# Patient Record
Sex: Male | Born: 1965 | Race: White | Hispanic: No | Marital: Married | Smoking: Never smoker
Health system: Southern US, Community
[De-identification: ages and names within clinical notes are randomized; demographics above are authoritative.]

## PROBLEM LIST (undated history)

## (undated) DIAGNOSIS — K5730 Diverticulosis of large intestine without perforation or abscess without bleeding: Secondary | ICD-10-CM

## (undated) DIAGNOSIS — E079 Disorder of thyroid, unspecified: Secondary | ICD-10-CM

## (undated) DIAGNOSIS — Q249 Congenital malformation of heart, unspecified: Secondary | ICD-10-CM

## (undated) DIAGNOSIS — C801 Malignant (primary) neoplasm, unspecified: Secondary | ICD-10-CM

## (undated) HISTORY — DX: Malignant (primary) neoplasm, unspecified: C80.1

## (undated) HISTORY — DX: Congenital malformation of heart, unspecified: Q24.9

## (undated) HISTORY — PX: OTHER SURGICAL HISTORY: SHX169

## (undated) HISTORY — DX: Disorder of thyroid, unspecified: E07.9

## (undated) HISTORY — PX: HERNIA REPAIR: SHX51

## (undated) HISTORY — PX: TONSILLECTOMY: SUR1361

## (undated) HISTORY — PX: THYROIDECTOMY: SHX17

## (undated) HISTORY — PX: EYE SURGERY: SHX253

## (undated) HISTORY — DX: Diverticulosis of large intestine without perforation or abscess without bleeding: K57.30

## (undated) HISTORY — PX: INGUINAL HERNIA REPAIR: SUR1180

---

## 2016-05-11 DIAGNOSIS — I83813 Varicose veins of bilateral lower extremities with pain: Secondary | ICD-10-CM | POA: Insufficient documentation

## 2016-10-02 DIAGNOSIS — H52223 Regular astigmatism, bilateral: Secondary | ICD-10-CM | POA: Insufficient documentation

## 2016-10-02 DIAGNOSIS — H532 Diplopia: Secondary | ICD-10-CM | POA: Insufficient documentation

## 2020-08-02 ENCOUNTER — Other Ambulatory Visit: Payer: Self-pay

## 2020-08-03 ENCOUNTER — Ambulatory Visit (INDEPENDENT_AMBULATORY_CARE_PROVIDER_SITE_OTHER): Admitting: Family Medicine

## 2020-08-03 ENCOUNTER — Encounter: Payer: Self-pay | Admitting: Family Medicine

## 2020-08-03 VITALS — BP 118/76 | HR 70 | Temp 97.6°F | Ht 72.0 in | Wt 207.0 lb

## 2020-08-03 DIAGNOSIS — E041 Nontoxic single thyroid nodule: Secondary | ICD-10-CM

## 2020-08-03 DIAGNOSIS — Z0000 Encounter for general adult medical examination without abnormal findings: Secondary | ICD-10-CM

## 2020-08-03 DIAGNOSIS — E079 Disorder of thyroid, unspecified: Secondary | ICD-10-CM

## 2020-08-03 DIAGNOSIS — F5101 Primary insomnia: Secondary | ICD-10-CM | POA: Diagnosis not present

## 2020-08-03 LAB — CBC
HCT: 40.1 % (ref 39.0–52.0)
Hemoglobin: 13.3 g/dL (ref 13.0–17.0)
MCHC: 33.3 g/dL (ref 30.0–36.0)
MCV: 82.5 fl (ref 78.0–100.0)
Platelets: 281 10*3/uL (ref 150.0–400.0)
RBC: 4.86 Mil/uL (ref 4.22–5.81)
RDW: 18.6 % — ABNORMAL HIGH (ref 11.5–15.5)
WBC: 5.4 10*3/uL (ref 4.0–10.5)

## 2020-08-03 LAB — COMPREHENSIVE METABOLIC PANEL
ALT: 20 U/L (ref 0–53)
AST: 22 U/L (ref 0–37)
Albumin: 4.4 g/dL (ref 3.5–5.2)
Alkaline Phosphatase: 58 U/L (ref 39–117)
BUN: 18 mg/dL (ref 6–23)
CO2: 29 mEq/L (ref 19–32)
Calcium: 9.9 mg/dL (ref 8.4–10.5)
Chloride: 104 mEq/L (ref 96–112)
Creatinine, Ser: 1.21 mg/dL (ref 0.40–1.50)
GFR: 62.78 mL/min (ref >60.00)
Glucose, Bld: 94 mg/dL (ref 70–99)
Potassium: 4.4 mEq/L (ref 3.5–5.1)
Sodium: 139 mEq/L (ref 135–145)
Total Bilirubin: 0.4 mg/dL (ref 0.2–1.2)
Total Protein: 7.1 g/dL (ref 6.0–8.3)

## 2020-08-03 LAB — LIPID PANEL
Cholesterol: 233 mg/dL — ABNORMAL HIGH (ref 0–200)
HDL: 50.2 mg/dL (ref >39.00)
LDL Cholesterol: 153 mg/dL — ABNORMAL HIGH (ref 0–99)
NonHDL: 182.5
Total CHOL/HDL Ratio: 5
Triglycerides: 146 mg/dL (ref 0.0–149.0)
VLDL: 29.2 mg/dL (ref 0.0–40.0)

## 2020-08-03 LAB — URINALYSIS, ROUTINE W REFLEX MICROSCOPIC
Bilirubin Urine: NEGATIVE
Hgb urine dipstick: NEGATIVE
Ketones, ur: NEGATIVE
Leukocytes,Ua: NEGATIVE
Nitrite: NEGATIVE
RBC / HPF: NONE SEEN (ref 0–?)
Specific Gravity, Urine: 1.025 (ref 1.000–1.030)
Total Protein, Urine: NEGATIVE
Urine Glucose: NEGATIVE
Urobilinogen, UA: 0.2 (ref 0.0–1.0)
pH: 5.5 (ref 5.0–8.0)

## 2020-08-03 LAB — PSA: PSA: 0.54 ng/mL (ref 0.10–4.00)

## 2020-08-03 LAB — LDL CHOLESTEROL, DIRECT: Direct LDL: 152 mg/dL

## 2020-08-03 LAB — TSH: TSH: 1.26 u[IU]/mL (ref 0.35–4.50)

## 2020-08-03 NOTE — Patient Instructions (Signed)
Health Maintenance, Male Adopting a healthy lifestyle and getting preventive care are important in promoting health and wellness. Ask your health care provider about:  The right schedule for you to have regular tests and exams.  Things you can do on your own to prevent diseases and keep yourself healthy. What should I know about diet, weight, and exercise? Eat a healthy diet   Eat a diet that includes plenty of vegetables, fruits, low-fat dairy products, and lean protein.  Do not eat a lot of foods that are high in solid fats, added sugars, or sodium. Maintain a healthy weight Body mass index (BMI) is a measurement that can be used to identify possible weight problems. It estimates body fat based on height and weight. Your health care provider can help determine your BMI and help you achieve or maintain a healthy weight. Get regular exercise Get regular exercise. This is one of the most important things you can do for your health. Most adults should:  Exercise for at least 150 minutes each week. The exercise should increase your heart rate and make you sweat (moderate-intensity exercise).  Do strengthening exercises at least twice a week. This is in addition to the moderate-intensity exercise.  Spend less time sitting. Even light physical activity can be beneficial. Watch cholesterol and blood lipids Have your blood tested for lipids and cholesterol at 53 years of age, then have this test every 5 years. You may need to have your cholesterol levels checked more often if:  Your lipid or cholesterol levels are high.  You are older than 53 years of age.  You are at high risk for heart disease. What should I know about cancer screening? Many types of cancers can be detected early and may often be prevented. Depending on your health history and family history, you may need to have cancer screening at various ages. This may include screening for:  Colorectal cancer.  Prostate  cancer.  Skin cancer.  Lung cancer. What should I know about heart disease, diabetes, and high blood pressure? Blood pressure and heart disease  High blood pressure causes heart disease and increases the risk of stroke. This is more likely to develop in people who have high blood pressure readings, are of African descent, or are overweight.  Talk with your health care provider about your target blood pressure readings.  Have your blood pressure checked: ? Every 3-5 years if you are 18-39 years of age. ? Every year if you are 40 years old or older.  If you are between the ages of 65 and 75 and are a current or former smoker, ask your health care provider if you should have a one-time screening for abdominal aortic aneurysm (AAA). Diabetes Have regular diabetes screenings. This checks your fasting blood sugar level. Have the screening done:  Once every three years after age 45 if you are at a normal weight and have a low risk for diabetes.  More often and at a younger age if you are overweight or have a high risk for diabetes. What should I know about preventing infection? Hepatitis B If you have a higher risk for hepatitis B, you should be screened for this virus. Talk with your health care provider to find out if you are at risk for hepatitis B infection. Hepatitis C Blood testing is recommended for:  Everyone born from 1945 through 1965.  Anyone with known risk factors for hepatitis C. Sexually transmitted infections (STIs)  You should be screened each year   for STIs, including gonorrhea and chlamydia, if: ? You are sexually active and are younger than 53 years of age. ? You are older than 53 years of age and your health care provider tells you that you are at risk for this type of infection. ? Your sexual activity has changed since you were last screened, and you are at increased risk for chlamydia or gonorrhea. Ask your health care provider if you are at risk.  Ask your  health care provider about whether you are at high risk for HIV. Your health care provider may recommend a prescription medicine to help prevent HIV infection. If you choose to take medicine to prevent HIV, you should first get tested for HIV. You should then be tested every 3 months for as long as you are taking the medicine. Follow these instructions at home: Lifestyle  Do not use any products that contain nicotine or tobacco, such as cigarettes, e-cigarettes, and chewing tobacco. If you need help quitting, ask your health care provider.  Do not use street drugs.  Do not share needles.  Ask your health care provider for help if you need support or information about quitting drugs. Alcohol use  Do not drink alcohol if your health care provider tells you not to drink.  If you drink alcohol: ? Limit how much you have to 0-2 drinks a day. ? Be aware of how much alcohol is in your drink. In the [place] one drink equals one 12 oz bottle of beer (355 mL), one 5 oz glass of wine (148 mL), or one 1 oz glass of hard liquor (44 mL). General instructions  Schedule regular health, dental, and eye exams.  Stay current with your vaccines.  Tell your health care provider if: ? You often feel depressed. ? You have ever been abused or do not feel safe at home. Summary  Adopting a healthy lifestyle and getting preventive care are important in promoting health and wellness.  Follow your health care provider's instructions about healthy diet, exercising, and getting tested or screened for diseases.  Follow your health care provider's instructions on monitoring your cholesterol and blood pressure. This information is not intended to replace advice given to you by your health care provider. Make sure you discuss any questions you have with your health care provider. Document Revised: 11/27/2018 Document Reviewed: 11/27/2018 Elsevier Patient Education  [place].  Insomnia Insomnia is a  sleep disorder that makes it difficult to fall asleep or stay asleep. Insomnia can cause fatigue, low energy, difficulty concentrating, mood swings, and poor performance at work or school. There are three different ways to classify insomnia:  Difficulty falling asleep.  Difficulty staying asleep.  Waking up too early in the morning. Any type of insomnia can be long-term (chronic) or short-term (acute). Both are common. Short-term insomnia usually lasts for three months or less. Chronic insomnia occurs at least three times a week for longer than three months. What are the causes? Insomnia may be caused by another condition, situation, or substance, such as:  Anxiety.  Certain medicines.  Gastroesophageal reflux disease (GERD) or other gastrointestinal conditions.  Asthma or other breathing conditions.  Restless legs syndrome, sleep apnea, or other sleep disorders.  Chronic pain.  Menopause.  Stroke.  Abuse of alcohol, tobacco, or illegal drugs.  Mental health conditions, such as depression.  Caffeine.  Neurological disorders, such as Alzheimer's disease.  An overactive thyroid (hyperthyroidism). Sometimes, the cause of insomnia may not be known. What increases the  risk? Risk factors for insomnia include:  Gender. Women are affected more often than men.  Age. Insomnia is more common as you get older.  Stress.  Lack of exercise.  Irregular work schedule or working night shifts.  Traveling between different time zones.  Certain medical and mental health conditions. What are the signs or symptoms? If you have insomnia, the main symptom is having trouble falling asleep or having trouble staying asleep. This may lead to other symptoms, such as:  Feeling fatigued or having low energy.  Feeling nervous about going to sleep.  Not feeling rested in the morning.  Having trouble concentrating.  Feeling irritable, anxious, or depressed. How is this diagnosed? This  condition may be diagnosed based on:  Your symptoms and medical history. Your health care provider may ask about: ? Your sleep habits. ? Any medical conditions you have. ? Your mental health.  A physical exam. How is this treated? Treatment for insomnia depends on the cause. Treatment may focus on treating an underlying condition that is causing insomnia. Treatment may also include:  Medicines to help you sleep.  Counseling or therapy.  Lifestyle adjustments to help you sleep better. Follow these instructions at home: Eating and drinking   Limit or avoid alcohol, caffeinated beverages, and cigarettes, especially close to bedtime. These can disrupt your sleep.  Do not eat a large meal or eat spicy foods right before bedtime. This can lead to digestive discomfort that can make it hard for you to sleep. Sleep habits   Keep a sleep diary to help you and your health care provider figure out what could be causing your insomnia. Write down: ? When you sleep. ? When you wake up during the night. ? How well you sleep. ? How rested you feel the next day. ? Any side effects of medicines you are taking. ? What you eat and drink.  Make your bedroom a dark, comfortable place where it is easy to fall asleep. ? Put up shades or blackout curtains to block light from outside. ? Use a white noise machine to block noise. ? Keep the temperature cool.  Limit screen use before bedtime. This includes: ? Watching TV. ? Using your smartphone, tablet, or computer.  Stick to a routine that includes going to bed and waking up at the same times every day and night. This can help you fall asleep faster. Consider making a quiet activity, such as reading, part of your nighttime routine.  Try to avoid taking naps during the day so that you sleep better at night.  Get out of bed if you are still awake after 15 minutes of trying to sleep. Keep the lights down, but try reading or doing a quiet activity.  When you feel sleepy, go back to bed. General instructions  Take over-the-counter and prescription medicines only as told by your health care provider.  Exercise regularly, as told by your health care provider. Avoid exercise starting several hours before bedtime.  Use relaxation techniques to manage stress. Ask your health care provider to suggest some techniques that may work well for you. These may include: ? Breathing exercises. ? Routines to release muscle tension. ? Visualizing peaceful scenes.  Make sure that you drive carefully. Avoid driving if you feel very sleepy.  Keep all follow-up visits as told by your health care provider. This is important. Contact a health care provider if:  You are tired throughout the day.  You have trouble in your daily routine due  to sleepiness.  You continue to have sleep problems, or your sleep problems get worse. Get help right away if:  You have serious thoughts about hurting yourself or someone else. If you ever feel like you may hurt yourself or others, or have thoughts about taking your own life, get help right away. You can go to your nearest emergency department or call:  Your local emergency services (911 in the [place]  A suicide crisis helpline, such as the [facility] at (804)793-2775. This is open 24 hours a day. Summary  Insomnia is a sleep disorder that makes it difficult to fall asleep or stay asleep.  Insomnia can be long-term (chronic) or short-term (acute).  Treatment for insomnia depends on the cause. Treatment may focus on treating an underlying condition that is causing insomnia.  Keep a sleep diary to help you and your health care provider figure out what could be causing your insomnia. This information is not intended to replace advice given to you by your health care provider. Make sure you discuss any questions you have with your health care provider. Document Revised: 11/16/2017  Document Reviewed: 09/13/2017 Elsevier Patient Education  2020 [employer]  Preventive Care 35-59 Years Old, Male Preventive care refers to lifestyle choices and visits with your health care provider that can promote health and wellness. This includes:  A yearly physical exam. This is also called an annual well check.  Regular dental and eye exams.  Immunizations.  Screening for certain conditions.  Healthy lifestyle choices, such as eating a healthy diet, getting regular exercise, not using drugs or products that contain nicotine and tobacco, and limiting alcohol use. What can I expect for my preventive care visit? Physical exam Your health care provider will check:  Height and weight. These may be used to calculate body mass index (BMI), which is a measurement that tells if you are at a healthy weight.  Heart rate and blood pressure.  Your skin for abnormal spots. Counseling Your health care provider may ask you questions about:  Alcohol, tobacco, and drug use.  Emotional well-being.  Home and relationship well-being.  Sexual activity.  Eating habits.  Work and work Statistician. What immunizations do I need?  Influenza (flu) vaccine  This is recommended every year. Tetanus, diphtheria, and pertussis (Tdap) vaccine  You may need a Td booster every 10 years. Varicella (chickenpox) vaccine  You may need this vaccine if you have not already been vaccinated. Zoster (shingles) vaccine  You may need this after age 15. Measles, mumps, and rubella (MMR) vaccine  You may need at least one dose of MMR if you were born in 1957 or later. You may also need a second dose. Pneumococcal conjugate (PCV13) vaccine  You may need this if you have certain conditions and were not previously vaccinated. Pneumococcal polysaccharide (PPSV23) vaccine  You may need one or two doses if you smoke cigarettes or if you have certain conditions. Meningococcal conjugate (MenACWY)  vaccine  You may need this if you have certain conditions. Hepatitis A vaccine  You may need this if you have certain conditions or if you travel or work in places where you may be exposed to hepatitis A. Hepatitis B vaccine  You may need this if you have certain conditions or if you travel or work in places where you may be exposed to hepatitis B. Haemophilus influenzae type b (Hib) vaccine  You may need this if you have certain risk factors. Human papillomavirus (HPV) vaccine  If recommended by your health care provider, you may need three doses over 6 months. You may receive vaccines as individual doses or as more than one vaccine together in one shot (combination vaccines). Talk with your health care provider about the risks and benefits of combination vaccines. What tests do I need? Blood tests  Lipid and cholesterol levels. These may be checked every 5 years, or more frequently if you are over 55 years old.  Hepatitis C test.  Hepatitis B test. Screening  Lung cancer screening. You may have this screening every year starting at age 17 if you have a 30-pack-year history of smoking and currently smoke or have quit within the past 15 years.  Prostate cancer screening. Recommendations will vary depending on your family history and other risks.  Colorectal cancer screening. All adults should have this screening starting at age 59 and continuing until age 37. Your health care provider may recommend screening at age 62 if you are at increased risk. You will have tests every 1-10 years, depending on your results and the type of screening test.  Diabetes screening. This is done by checking your blood sugar (glucose) after you have not eaten for a while (fasting). You may have this done every 1-3 years.  Sexually transmitted disease (STD) testing. Follow these instructions at home: Eating and drinking  Eat a diet that includes fresh fruits and vegetables, whole grains, lean protein,  and low-fat dairy products.  Take vitamin and mineral supplements as recommended by your health care provider.  Do not drink alcohol if your health care provider tells you not to drink.  If you drink alcohol: ? Limit how much you have to 0-2 drinks a day. ? Be aware of how much alcohol is in your drink. In the [place] one drink equals one 12 oz bottle of beer (355 mL), one 5 oz glass of wine (148 mL), or one 1 oz glass of hard liquor (44 mL). Lifestyle  Take daily care of your teeth and gums.  Stay active. Exercise for at least 30 minutes on 5 or more days each week.  Do not use any products that contain nicotine or tobacco, such as cigarettes, e-cigarettes, and chewing tobacco. If you need help quitting, ask your health care provider.  If you are sexually active, practice safe sex. Use a condom or other form of protection to prevent STIs (sexually transmitted infections).  Talk with your health care provider about taking a low-dose aspirin every day starting at age 33. What's next?  Go to your health care provider once a year for a well check visit.  Ask your health care provider how often you should have your eyes and teeth checked.  Stay up to date on all vaccines. This information is not intended to replace advice given to you by your health care provider. Make sure you discuss any questions you have with your health care provider. Document Revised: 11/28/2018 Document Reviewed: 11/28/2018 Elsevier Patient Education  2020 [employer].

## 2020-08-03 NOTE — Progress Notes (Addendum)
New Patient Office Visit  Subjective:  Patient ID: Antonio Graham, male    DOB: 1966/12/06  Age: 54 y.o. MRN: [id number]  CC:  Chief Complaint  Patient presents with  . Establish Care    New patient, no concerns patient fasting for labs.     HPI Antonio Graham presents for establishment of care, physical exam and follow-up of longstanding insomnia.  Insomnia has been treated effectively with 6.25 mg of Ambien.  Medication works well for him.  He denies any sleep aberrancy and his wife confirms.  Married for 30 years.  3 of his 4 children are grown and flown.  One still lives in a house.  Moved to this area back in 2019 problem.  He is working out on a regular basis.  He does not smoke or use illicit drugs.  He drinks alcohol rarely.  Concerned about his thyroid.  Exercises regularly.  Parents are in their 3s in relatively good health.  Father smokes 2 packs of cigarettes daily and still runs a small farm  History reviewed. No pertinent past medical history.  Past Surgical History:  Procedure Laterality Date  . EYE SURGERY    . HERNIA REPAIR    . thumb surgery Right   . TONSILLECTOMY      Family History  Problem Relation Age of Onset  . Healthy Mother   . Healthy Father     Social History   Socioeconomic History  . Marital status: Married    Spouse name: Not on file  . Number of children: Not on file  . Years of education: Not on file  . Highest education level: Not on file  Occupational History  . Not on file  Tobacco Use  . Smoking status: Never Smoker  . Smokeless tobacco: Never Used  Vaping Use  . Vaping Use: Never used  Substance and Sexual Activity  . Alcohol use: Yes    Alcohol/week: 2.0 standard drinks    Types: 1 Cans of beer, 1 Shots of liquor per week  . Drug use: Never  . Sexual activity: Yes  Other Topics Concern  . Not on file  Social History Narrative  . Not on file   Social Determinants of Health   Financial Resource Strain:   .  Difficulty of Paying Living Expenses: Not on file  Food Insecurity:   . Worried About Charity fundraiser in the Last Year: Not on file  . Ran Out of Food in the Last Year: Not on file  Transportation Needs:   . Lack of Transportation (Medical): Not on file  . Lack of Transportation (Non-Medical): Not on file  Physical Activity:   . Days of Exercise per Week: Not on file  . Minutes of Exercise per Session: Not on file  Stress:   . Feeling of Stress : Not on file  Social Connections:   . Frequency of Communication with Friends and Family: Not on file  . Frequency of Social Gatherings with Friends and Family: Not on file  . Attends Religious Services: Not on file  . Active Member of Clubs or Organizations: Not on file  . Attends Archivist Meetings: Not on file  . Marital Status: Not on file  Intimate Partner Violence:   . Fear of Current or Ex-Partner: Not on file  . Emotionally Abused: Not on file  . Physically Abused: Not on file  . Sexually Abused: Not on file    ROS Review of Systems  Constitutional:  Negative.   HENT: Negative.   Eyes: Negative for photophobia and visual disturbance.  Respiratory: Negative.   Cardiovascular: Negative.   Gastrointestinal: Negative.  Negative for anal bleeding, blood in stool, constipation and diarrhea.  Endocrine: Negative for cold intolerance, heat intolerance, polyphagia and polyuria.  Genitourinary: Negative for difficulty urinating, frequency and urgency.  Musculoskeletal: Negative for gait problem and joint swelling.  Skin: Negative for color change and pallor.  Neurological: Negative for tremors and speech difficulty.  Hematological: Does not bruise/bleed easily.  Psychiatric/Behavioral: Negative.    Depression screen [facility] 2/9 08/03/2020 08/03/2020  Decreased Interest 0 0  Down, Depressed, Hopeless 0 0  PHQ - 2 Score 0 0  Altered sleeping 2 -  Tired, decreased energy 1 -  Change in appetite 1 -  Feeling bad or failure  about yourself  0 -  Trouble concentrating 0 -  Moving slowly or fidgety/restless 0 -  Suicidal thoughts 0 -  PHQ-9 Score 4 -  Difficult doing work/chores Not difficult at all -    Objective:   Today's Vitals: BP 118/76   Pulse 70   Temp 97.6 F (36.4 C) (Tympanic)   Ht 6' (1.829 m)   Wt 207 lb (93.9 kg)   SpO2 96%   BMI 28.07 kg/m   Physical Exam Vitals and nursing note reviewed.  Constitutional:      General: He is not in acute distress.    Appearance: Normal appearance. He is normal weight. He is not ill-appearing, toxic-appearing or diaphoretic.  HENT:     Head: Normocephalic and atraumatic.     Right Ear: Tympanic membrane, ear canal and external ear normal.     Left Ear: Tympanic membrane, ear canal and external ear normal.     Nose: Nose normal. No congestion or rhinorrhea.     Mouth/Throat:     Mouth: Mucous membranes are moist.     Pharynx: Oropharynx is clear. No oropharyngeal exudate or posterior oropharyngeal erythema.  Eyes:     General: No scleral icterus.       Right eye: No discharge.        Left eye: No discharge.     Extraocular Movements: Extraocular movements intact.     Conjunctiva/sclera: Conjunctivae normal.     Pupils: Pupils are equal, round, and reactive to light.  Neck:     Vascular: No carotid bruit.   Cardiovascular:     Rate and Rhythm: Normal rate and regular rhythm.  Pulmonary:     Effort: Pulmonary effort is normal.     Breath sounds: Normal breath sounds.  Abdominal:     General: Abdomen is flat. Bowel sounds are normal. There is no distension.     Palpations: Abdomen is soft. There is no mass.     Tenderness: There is no abdominal tenderness. There is no guarding or rebound.     Hernia: No hernia is present. There is no hernia in the left inguinal area or right inguinal area.  Genitourinary:    Penis: Normal and circumcised. No hypospadias, erythema, tenderness, discharge or swelling.      Testes:        Right: Mass,  tenderness or swelling not present. Right testis is descended.        Left: Mass, tenderness or swelling not present. Left testis is descended.     Epididymis:     Right: Not inflamed or enlarged.     Left: Not inflamed or enlarged.  Musculoskeletal:     Cervical  back: No rigidity or tenderness.  Lymphadenopathy:     Cervical: No cervical adenopathy.     Lower Body: No right inguinal adenopathy. No left inguinal adenopathy.  Neurological:     Mental Status: He is alert.     Assessment & Plan:   Problem List Items Addressed This Visit    None    Visit Diagnoses    Healthcare maintenance    -  Primary   Relevant Orders   CBC (Completed)   Comprehensive metabolic panel (Completed)   LDL cholesterol, direct (Completed)   Lipid panel (Completed)   PSA (Completed)   Urinalysis, Routine w reflex microscopic (Completed)   Ambulatory referral to Gastroenterology   Primary insomnia       Thyroid condition       Relevant Orders   TSH (Completed)   US THYROID (Completed)   Ambulatory referral to Endocrinology   Thyroid nodule       Relevant Orders   Ambulatory referral to Interventional Radiology      Outpatient Encounter Medications as of 08/03/2020  Medication Sig  . Multiple Vitamin (MULTI VITAMIN MENS) tablet Take 1 tablet by mouth daily.  Marland Kitchen zolpidem (AMBIEN CR) 6.25 MG CR tablet Take 6.25 mg by mouth at bedtime.   No facility-administered encounter medications on file as of 08/03/2020.    Follow-up: Return in about 1 year (around 08/03/2021), or if symptoms worsen or fail to improve.  Given information on health maintenance and disease prevention as well as insomnia. Libby Maw, MD

## 2020-08-31 ENCOUNTER — Ambulatory Visit
Admission: RE | Admit: 2020-08-31 | Discharge: 2020-08-31 | Disposition: A | Discharge status: Home / Self Care | Source: Ambulatory Visit | Attending: Family Medicine | Admitting: Family Medicine

## 2020-08-31 ENCOUNTER — Other Ambulatory Visit: Payer: Self-pay

## 2020-08-31 DIAGNOSIS — E079 Disorder of thyroid, unspecified: Secondary | ICD-10-CM

## 2020-09-02 ENCOUNTER — Encounter: Payer: Self-pay | Admitting: Family Medicine

## 2020-09-02 NOTE — Telephone Encounter (Signed)
Have ordered a FNB and consult with endocrinology.

## 2020-09-02 NOTE — Addendum Note (Signed)
Addended by: Jon Billings on: 09/02/2020 01:27 PM   Modules accepted: Orders

## 2020-09-06 NOTE — Addendum Note (Signed)
Addended by: Jon Billings on: 09/06/2020 01:08 PM   Modules accepted: Orders

## 2020-09-07 ENCOUNTER — Other Ambulatory Visit: Payer: Self-pay | Admitting: Family Medicine

## 2020-09-07 DIAGNOSIS — E041 Nontoxic single thyroid nodule: Secondary | ICD-10-CM

## 2020-09-14 ENCOUNTER — Other Ambulatory Visit

## 2020-09-21 ENCOUNTER — Other Ambulatory Visit

## 2020-09-23 ENCOUNTER — Ambulatory Visit
Admission: RE | Admit: 2020-09-23 | Discharge: 2020-09-23 | Disposition: A | Discharge status: Home / Self Care | Source: Ambulatory Visit | Attending: Family Medicine | Admitting: Family Medicine

## 2020-09-23 ENCOUNTER — Other Ambulatory Visit (HOSPITAL_COMMUNITY)
Admission: RE | Admit: 2020-09-23 | Discharge: 2020-09-23 | Disposition: A | Discharge status: Home / Self Care | Source: Ambulatory Visit | Attending: Student | Admitting: Student

## 2020-09-23 DIAGNOSIS — E041 Nontoxic single thyroid nodule: Secondary | ICD-10-CM

## 2020-09-23 DIAGNOSIS — C73 Malignant neoplasm of thyroid gland: Secondary | ICD-10-CM | POA: Diagnosis not present

## 2020-09-28 LAB — CYTOLOGY - NON PAP

## 2020-09-30 NOTE — Telephone Encounter (Signed)
I would wait for endocrinology consult before seeing the surgeon.  Fine needle biopsies of thyroid nodules are unreliable and he will need to be under an endocrinologist's care anyway if he does indeed have thyroid cancer.

## 2020-10-18 ENCOUNTER — Encounter: Payer: Self-pay | Admitting: Family Medicine

## 2020-10-20 ENCOUNTER — Other Ambulatory Visit: Payer: Self-pay

## 2020-10-20 ENCOUNTER — Encounter: Payer: Self-pay | Admitting: Endocrinology

## 2020-10-20 ENCOUNTER — Ambulatory Visit (INDEPENDENT_AMBULATORY_CARE_PROVIDER_SITE_OTHER): Admitting: Endocrinology

## 2020-10-20 DIAGNOSIS — E041 Nontoxic single thyroid nodule: Secondary | ICD-10-CM | POA: Insufficient documentation

## 2020-10-20 NOTE — Patient Instructions (Addendum)
Please see a surgery specialist.  you will receive a phone call, about a day and time for an appointment.   Please consider the Radioactive iodine treatment pill options we discussed today.   Please come back after you get out of the hospital.

## 2020-10-20 NOTE — Progress Notes (Signed)
Subjective:    Patient ID: Antonio Graham, male    DOB: 09-14-1966, 54 y.o.   MRN: [id number]  HPI Pt is referred by Dr Ethelene Hal, for nodular thyroid.  Pt was noted to have a nodule at the thyroid in 2021, when he was shaving.  He is unaware of ever having had thyroid problems in the past.  He has no h/o XRT or surgery to the neck.    Past Surgical History:  Procedure Laterality Date  . EYE SURGERY    . HERNIA REPAIR    . thumb surgery Right   . TONSILLECTOMY      Social History   Socioeconomic History  . Marital status: Married    Spouse name: Not on file  . Number of children: Not on file  . Years of education: Not on file  . Highest education level: Not on file  Occupational History  . Not on file  Tobacco Use  . Smoking status: Never Smoker  . Smokeless tobacco: Never Used  Vaping Use  . Vaping Use: Never used  Substance and Sexual Activity  . Alcohol use: Yes    Alcohol/week: 2.0 standard drinks    Types: 1 Cans of beer, 1 Shots of liquor per week  . Drug use: Never  . Sexual activity: Yes  Other Topics Concern  . Not on file  Social History Narrative  . Not on file   Social Determinants of Health   Financial Resource Strain:   . Difficulty of Paying Living Expenses: Not on file  Food Insecurity:   . Worried About Charity fundraiser in the Last Year: Not on file  . Ran Out of Food in the Last Year: Not on file  Transportation Needs:   . Lack of Transportation (Medical): Not on file  . Lack of Transportation (Non-Medical): Not on file  Physical Activity:   . Days of Exercise per Week: Not on file  . Minutes of Exercise per Session: Not on file  Stress:   . Feeling of Stress : Not on file  Social Connections:   . Frequency of Communication with Friends and Family: Not on file  . Frequency of Social Gatherings with Friends and Family: Not on file  . Attends Religious Services: Not on file  . Active Member of Clubs or Organizations: Not on file  .  Attends Archivist Meetings: Not on file  . Marital Status: Not on file  Intimate Partner Violence:   . Fear of Current or Ex-Partner: Not on file  . Emotionally Abused: Not on file  . Physically Abused: Not on file  . Sexually Abused: Not on file    Current Outpatient Medications on File Prior to Visit  Medication Sig Dispense Refill  . Multiple Vitamin (MULTI VITAMIN MENS) tablet Take 1 tablet by mouth daily.    Marland Kitchen zolpidem (AMBIEN CR) 6.25 MG CR tablet Take 6.25 mg by mouth at bedtime.     No current facility-administered medications on file prior to visit.    No Known Allergies  Family History  Problem Relation Age of Onset  . Healthy Mother   . Healthy Father   . Thyroid disease Neg Hx     BP 128/80   Pulse 83   Ht 6' (1.829 m)   Wt 209 lb (94.8 kg)   SpO2 98%   BMI 28.35 kg/m    Review of Systems Denies hoarseness, neck pain, sob, dysphagia, diarrhea, and flushing.  He has s  slight dry cough.     Objective:   Physical Exam VITAL SIGNS:  See vs page GENERAL: no distress NECK: 2 cm left thyroid nodule is freely mobile.     Lab Results  Component Value Date   TSH 1.26 08/03/2020   [place]: Single mass versus two adjacent nodular suspicious masses within the inferior aspect of the left lobe of the thyroid gland as described above. This either represents two separate adjacent nodules measuring roughly 3 cm each or one larger mass area. Sonographic features are suspicious for malignancy and fine-needle aspiration is recommended. Technically, biopsy is recommended of both nodule 1 and nodule 2 above. Detailed imaging at the time of biopsy may be able to further delineate whether this appears to be truly a single mass or two separate masses.  Bx: Suspicious for malignancy (Bethesda category V).    SPECIMEN ADEQUACY:  Satisfactory for evaluation   DIAGNOSTIC COMMENTS:  Suspicious for papillary carcinoma.    I have reviewed outside records, and  summarized: Pt was noted to have thyroid nodule, and referred here.  This ws noted at wellness visit.  General health was good.     Assessment & Plan:  Thyroid nodule, new to me, high risk of malignancy.  We discussed the need for surgery.   Patient Instructions  Please see a surgery specialist.  you will receive a phone call, about a day and time for an appointment.   Please consider the Radioactive iodine treatment pill options we discussed today.   Please come back after you get out of the hospital.

## 2020-11-05 ENCOUNTER — Other Ambulatory Visit: Payer: Self-pay

## 2020-11-05 MED ORDER — ZOLPIDEM TARTRATE ER 6.25 MG PO TBCR
6.2500 mg | EXTENDED_RELEASE_TABLET | Freq: Every evening | ORAL | 0 refills | Status: DC
Start: 2020-11-05 — End: 2020-12-07

## 2020-11-05 NOTE — Telephone Encounter (Signed)
Last OV 08/03/20 Last fill 06/30/20

## 2020-11-19 ENCOUNTER — Telehealth: Payer: Self-pay | Admitting: Endocrinology

## 2020-11-19 NOTE — Telephone Encounter (Signed)
Pt had surgery and f/u at [facility] [place]. [facility] released back to [place] to f/u and arrange radioactive iodine  treatments. Per pt spouse Dena. Call [phone].

## 2020-11-19 NOTE — Telephone Encounter (Signed)
Needs f/u appt next available 

## 2020-11-19 NOTE — Telephone Encounter (Signed)
Antonio Graham if you have time can you schedule this please?

## 2020-11-22 NOTE — Telephone Encounter (Signed)
Appointment is on 12/8

## 2020-11-24 ENCOUNTER — Other Ambulatory Visit: Payer: Self-pay

## 2020-11-24 ENCOUNTER — Encounter: Payer: Self-pay | Admitting: Endocrinology

## 2020-11-24 ENCOUNTER — Ambulatory Visit (INDEPENDENT_AMBULATORY_CARE_PROVIDER_SITE_OTHER): Admitting: Endocrinology

## 2020-11-24 DIAGNOSIS — C73 Malignant neoplasm of thyroid gland: Secondary | ICD-10-CM | POA: Diagnosis not present

## 2020-11-24 DIAGNOSIS — E890 Postprocedural hypothyroidism: Secondary | ICD-10-CM

## 2020-11-24 NOTE — Patient Instructions (Addendum)
you will receive a phone call, about a day and time for an appointment, for the injections and iodine pill.  Please do the blood tests prior to the shots Then you would do a "post-therapy scan" a few days later. Please come back for a follow-up appointment in 3 months.

## 2020-11-24 NOTE — Progress Notes (Signed)
   Subjective:    Patient ID: Antonio Graham, male    DOB: 02-May-1966, 54 y.o.   MRN: [id number]  HPI Pt returns for f/u of PTC;  He had thyroidect 8 days ago.  pt states he feels well in general.  He takes synthroid as rx'ed.   No past medical history on file.  Past Surgical History:  Procedure Laterality Date  . EYE SURGERY    . HERNIA REPAIR    . thumb surgery Right   . TONSILLECTOMY      Social History   Socioeconomic History  . Marital status: Married    Spouse name: Not on file  . Number of children: Not on file  . Years of education: Not on file  . Highest education level: Not on file  Occupational History  . Not on file  Tobacco Use  . Smoking status: Never Smoker  . Smokeless tobacco: Never Used  Vaping Use  . Vaping Use: Never used  Substance and Sexual Activity  . Alcohol use: Yes    Alcohol/week: 2.0 standard drinks    Types: 1 Cans of beer, 1 Shots of liquor per week  . Drug use: Never  . Sexual activity: Yes  Other Topics Concern  . Not on file  Social History Narrative  . Not on file   Social Determinants of Health   Financial Resource Strain: Not on file  Food Insecurity: Not on file  Transportation Needs: Not on file  Physical Activity: Not on file  Stress: Not on file  Social Connections: Not on file  Intimate Partner Violence: Not on file    Current Outpatient Medications on File Prior to Visit  Medication Sig Dispense Refill  . levothyroxine (SYNTHROID) 137 MCG tablet Take by mouth.    . Multiple Vitamin (MULTI VITAMIN MENS) tablet Take 1 tablet by mouth daily.    Marland Kitchen zolpidem (AMBIEN CR) 6.25 MG CR tablet Take 1 tablet (6.25 mg total) by mouth at bedtime. 30 tablet 0   No current facility-administered medications on file prior to visit.    No Known Allergies  Family History  Problem Relation Age of Onset  . Healthy Mother   . Healthy Father   . Thyroid disease Neg Hx     BP 110/70   Pulse 63   Ht 6' (1.829 m)   Wt 209 lb  6.4 oz (95 kg)   SpO2 98%   BMI 28.40 kg/m     Review of Systems     Objective:   Physical Exam VITAL SIGNS:  See vs page.  GENERAL: no distress.  Neck: a healed scar is present.  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  He chooses RAI with thyrogen.   Patient Instructions  you will receive a phone call, about a day and time for an appointment, for the injections and iodine pill.  Please do the blood tests prior to the shots Then you would do a "post-therapy scan" a few days later. Please come back for a follow-up appointment in 3 months.

## 2020-11-27 DIAGNOSIS — E039 Hypothyroidism, unspecified: Secondary | ICD-10-CM | POA: Insufficient documentation

## 2020-12-07 ENCOUNTER — Telehealth: Payer: Self-pay

## 2020-12-07 MED ORDER — ZOLPIDEM TARTRATE ER 6.25 MG PO TBCR
6.2500 mg | EXTENDED_RELEASE_TABLET | Freq: Every evening | ORAL | 0 refills | Status: DC
Start: 2020-12-07 — End: 2021-01-05

## 2020-12-07 NOTE — Telephone Encounter (Signed)
Please see message and advise.  Thank you. Last OV 08/03/20 Last fill 11/05/20  #30/0

## 2020-12-11 ENCOUNTER — Encounter (INDEPENDENT_AMBULATORY_CARE_PROVIDER_SITE_OTHER): Payer: Self-pay

## 2020-12-13 ENCOUNTER — Other Ambulatory Visit (HOSPITAL_COMMUNITY)
Admission: RE | Admit: 2020-12-13 | Discharge: 2020-12-13 | Disposition: A | Discharge status: Home / Self Care | Source: Ambulatory Visit

## 2020-12-13 ENCOUNTER — Other Ambulatory Visit: Payer: Self-pay

## 2020-12-13 ENCOUNTER — Ambulatory Visit (HOSPITAL_COMMUNITY)
Admission: RE | Admit: 2020-12-13 | Discharge: 2020-12-13 | Disposition: A | Discharge status: Home / Self Care | Source: Ambulatory Visit | Attending: Endocrinology | Admitting: Endocrinology

## 2020-12-13 DIAGNOSIS — C73 Malignant neoplasm of thyroid gland: Secondary | ICD-10-CM | POA: Insufficient documentation

## 2020-12-13 LAB — BASIC METABOLIC PANEL
Anion gap: 9 (ref 5–15)
BUN: 17 mg/dL (ref 6–20)
CO2: 27 mmol/L (ref 22–32)
Calcium: 10.2 mg/dL (ref 8.9–10.3)
Chloride: 103 mmol/L (ref 98–111)
Creatinine, Ser: 1.19 mg/dL (ref 0.61–1.24)
GFR, Estimated: >60 mL/min (ref >60)
Glucose, Bld: 107 mg/dL — ABNORMAL HIGH (ref 70–99)
Potassium: 4.4 mmol/L (ref 3.5–5.1)
Sodium: 139 mmol/L (ref 135–145)

## 2020-12-13 LAB — T4, FREE: Free T4: 1.09 ng/dL (ref 0.61–1.12)

## 2020-12-13 LAB — TSH: TSH: 2.98 u[IU]/mL (ref 0.350–4.500)

## 2020-12-13 MED ORDER — THYROTROPIN ALFA 0.9 MG IM SOLR: 0.9000 mg | INTRAMUSCULAR | Status: AC

## 2020-12-13 MED ORDER — THYROTROPIN ALFA 0.9 MG IM SOLR
INTRAMUSCULAR | Status: AC
  Administered 2020-12-13: 0.9 mg via INTRAMUSCULAR
  Filled 2020-12-13: qty 0.9

## 2020-12-14 ENCOUNTER — Encounter (HOSPITAL_COMMUNITY)
Admission: RE | Admit: 2020-12-14 | Discharge: 2020-12-14 | Disposition: A | Discharge status: Home / Self Care | Source: Ambulatory Visit | Attending: Endocrinology | Admitting: Endocrinology

## 2020-12-14 DIAGNOSIS — C73 Malignant neoplasm of thyroid gland: Secondary | ICD-10-CM | POA: Diagnosis not present

## 2020-12-14 MED ORDER — THYROTROPIN ALFA 0.9 MG IM SOLR
INTRAMUSCULAR | Status: AC
  Administered 2020-12-14: 0.9 mg via INTRAMUSCULAR
  Filled 2020-12-14: qty 0.9

## 2020-12-14 MED ORDER — THYROTROPIN ALFA 0.9 MG IM SOLR: 0.9000 mg | INTRAMUSCULAR | Status: AC

## 2020-12-15 ENCOUNTER — Other Ambulatory Visit: Payer: Self-pay

## 2020-12-15 ENCOUNTER — Encounter (HOSPITAL_COMMUNITY)
Admission: RE | Admit: 2020-12-15 | Discharge: 2020-12-15 | Disposition: A | Discharge status: Home / Self Care | Source: Ambulatory Visit | Attending: Endocrinology | Admitting: Endocrinology

## 2020-12-15 DIAGNOSIS — C73 Malignant neoplasm of thyroid gland: Secondary | ICD-10-CM | POA: Insufficient documentation

## 2020-12-15 MED ORDER — SODIUM IODIDE I 131 CAPSULE
127.5000 | Freq: Once | INTRAVENOUS | Status: AC
  Administered 2020-12-15: 127.5 via ORAL

## 2020-12-20 ENCOUNTER — Ambulatory Visit (HOSPITAL_COMMUNITY)

## 2020-12-21 ENCOUNTER — Ambulatory Visit (HOSPITAL_COMMUNITY)

## 2020-12-22 ENCOUNTER — Ambulatory Visit (HOSPITAL_COMMUNITY)

## 2020-12-23 ENCOUNTER — Other Ambulatory Visit: Payer: Self-pay | Admitting: Endocrinology

## 2020-12-23 ENCOUNTER — Encounter: Payer: Self-pay | Admitting: Endocrinology

## 2020-12-23 ENCOUNTER — Ambulatory Visit (HOSPITAL_COMMUNITY)
Admission: RE | Admit: 2020-12-23 | Discharge: 2020-12-23 | Disposition: A | Discharge status: Home / Self Care | Source: Ambulatory Visit | Attending: Endocrinology | Admitting: Endocrinology

## 2020-12-23 ENCOUNTER — Other Ambulatory Visit: Payer: Self-pay

## 2020-12-23 DIAGNOSIS — C73 Malignant neoplasm of thyroid gland: Secondary | ICD-10-CM

## 2020-12-24 ENCOUNTER — Other Ambulatory Visit: Payer: Self-pay | Admitting: Endocrinology

## 2020-12-24 DIAGNOSIS — C73 Malignant neoplasm of thyroid gland: Secondary | ICD-10-CM

## 2020-12-31 ENCOUNTER — Ambulatory Visit (HOSPITAL_COMMUNITY)

## 2021-01-03 ENCOUNTER — Ambulatory Visit (HOSPITAL_COMMUNITY)

## 2021-01-03 ENCOUNTER — Other Ambulatory Visit (HOSPITAL_COMMUNITY)

## 2021-01-04 ENCOUNTER — Ambulatory Visit (HOSPITAL_COMMUNITY): Admission: RE | Admit: 2021-01-04 | Source: Ambulatory Visit

## 2021-01-04 ENCOUNTER — Encounter (HOSPITAL_COMMUNITY): Payer: Self-pay

## 2021-01-05 ENCOUNTER — Other Ambulatory Visit: Payer: Self-pay | Admitting: Family

## 2021-01-06 ENCOUNTER — Other Ambulatory Visit: Payer: Self-pay

## 2021-01-06 ENCOUNTER — Ambulatory Visit (HOSPITAL_COMMUNITY): Admission: RE | Admit: 2021-01-06 | Source: Ambulatory Visit

## 2021-01-06 ENCOUNTER — Encounter (HOSPITAL_COMMUNITY): Payer: Self-pay

## 2021-01-06 ENCOUNTER — Ambulatory Visit (HOSPITAL_COMMUNITY)
Admission: RE | Admit: 2021-01-06 | Discharge: 2021-01-06 | Disposition: A | Discharge status: Home / Self Care | Source: Ambulatory Visit | Attending: Endocrinology | Admitting: Endocrinology

## 2021-01-06 DIAGNOSIS — C73 Malignant neoplasm of thyroid gland: Secondary | ICD-10-CM | POA: Diagnosis present

## 2021-01-06 MED ORDER — GADOBUTROL 1 MMOL/ML IV SOLN
9.0000 mL | Freq: Once | INTRAVENOUS | Status: AC | PRN
  Administered 2021-01-06: 9 mL via INTRAVENOUS

## 2021-01-06 NOTE — Progress Notes (Signed)
Spoke with radiologist Dr. Jobe Igo regarding Chest MRI protocol.

## 2021-01-14 ENCOUNTER — Other Ambulatory Visit: Payer: Self-pay | Admitting: Endocrinology

## 2021-01-14 DIAGNOSIS — C73 Malignant neoplasm of thyroid gland: Secondary | ICD-10-CM

## 2021-01-18 DIAGNOSIS — Q231 Congenital insufficiency of aortic valve: Secondary | ICD-10-CM | POA: Insufficient documentation

## 2021-01-18 DIAGNOSIS — Q2381 Bicuspid aortic valve: Secondary | ICD-10-CM | POA: Insufficient documentation

## 2021-01-24 ENCOUNTER — Ambulatory Visit (HOSPITAL_COMMUNITY)
Admission: RE | Admit: 2021-01-24 | Discharge: 2021-01-24 | Disposition: A | Discharge status: Home / Self Care | Source: Ambulatory Visit | Attending: Endocrinology | Admitting: Endocrinology

## 2021-01-24 ENCOUNTER — Other Ambulatory Visit: Payer: Self-pay

## 2021-01-24 DIAGNOSIS — C73 Malignant neoplasm of thyroid gland: Secondary | ICD-10-CM | POA: Diagnosis not present

## 2021-01-26 ENCOUNTER — Encounter: Payer: Self-pay | Admitting: Endocrinology

## 2021-01-26 ENCOUNTER — Other Ambulatory Visit: Payer: Self-pay | Admitting: Endocrinology

## 2021-01-26 DIAGNOSIS — I712 Thoracic aortic aneurysm, without rupture, unspecified: Secondary | ICD-10-CM

## 2021-01-31 DIAGNOSIS — I712 Thoracic aortic aneurysm, without rupture: Secondary | ICD-10-CM | POA: Insufficient documentation

## 2021-01-31 DIAGNOSIS — I7121 Aneurysm of the ascending aorta, without rupture: Secondary | ICD-10-CM | POA: Insufficient documentation

## 2021-02-03 ENCOUNTER — Telehealth: Payer: Self-pay | Admitting: Endocrinology

## 2021-02-03 NOTE — Telephone Encounter (Signed)
LMTCB

## 2021-02-03 NOTE — Telephone Encounter (Signed)
Dr.Chen from [facility] recently saw pt in clinic and wanted to speak to [place] and update him on the pt. Please give him a call at [phone].

## 2021-02-07 ENCOUNTER — Encounter: Payer: Self-pay | Admitting: Family Medicine

## 2021-02-09 ENCOUNTER — Encounter: Payer: Self-pay | Admitting: Family Medicine

## 2021-02-10 ENCOUNTER — Telehealth (INDEPENDENT_AMBULATORY_CARE_PROVIDER_SITE_OTHER): Admitting: Family Medicine

## 2021-02-10 DIAGNOSIS — F419 Anxiety disorder, unspecified: Secondary | ICD-10-CM

## 2021-02-10 DIAGNOSIS — F439 Reaction to severe stress, unspecified: Secondary | ICD-10-CM | POA: Diagnosis not present

## 2021-02-10 NOTE — Patient Instructions (Signed)
       FOR YOUR ANXIETY AND STRESS:   []  Consider Cognitive Behavioral Therapy - this often is as effective as medications with fewer side effects. Please use the number provided to set up an appointment.  [place] is a good option.   Call for appointment: [phone]  []  Continue exercise as long as this has been ok with your surgeon and cardiovascular specialists.  []  Set a schedule that includes adequate time for sleep and fun activities.  []  Medications are best used short term while finding a healthier more balanced life that promotes good emotional and mental health. Please schedule an in person visit with your Primary care provider if any worsening, the above measures are not helpful or if you decide to try a medication. Your primary care provider may be able to prescribe controlled anxiety medications, or may suggest a referral to a psychiatrist.      I hope you are feeling better soon! Follow up with your doctor in 3-4 weeks or sooner if sooner if your symptoms worsen or new concerns arise.

## 2021-02-10 NOTE — Progress Notes (Signed)
Virtual Visit via Video Note  I connected with Antonio Graham  on 02/10/21 at  6:00 PM EST by a video enabled telemedicine application and verified that I am speaking with the correct person using two identifiers.  Location patient: home,  Location provider:work or home office Persons participating in the virtual visit: patient, provider  I discussed the limitations of evaluation and management by telemedicine and the availability of in person appointments. The patient expressed understanding and agreed to proceed.   HPI:  Acute telemedicine visit for Anxiety/Stress: -situational -Symptoms include: anxiety, feels overwhelmed, when running and gets any little sensation - wonders if it is something with his heart -he sometimes can calm the anxiety on his own with exercise and reasoning through things -recently diagnosed with thyroid cancer a few months ago, had to do surgery, seeing specialist for management, back to normal physically, but then diagnosed with aortic aneurysm as well (seeing specialist for this as well) -Denies: depression, panic attacks currently, SI, thoughts of harm, hallucinations, severe symptoms -Has tried: exercise   ROS: See pertinent positives and negatives per HPI.  No past medical history on file.  Past Surgical History:  Procedure Laterality Date  . EYE SURGERY    . HERNIA REPAIR    . thumb surgery Right   . TONSILLECTOMY       Current Outpatient Medications:  .  levothyroxine (SYNTHROID) 137 MCG tablet, Take by mouth., Disp: , Rfl:  .  Multiple Vitamin (MULTI VITAMIN MENS) tablet, Take 1 tablet by mouth daily., Disp: , Rfl:  .  zolpidem (AMBIEN CR) 6.25 MG CR tablet, TAKE 1 TABLET BY MOUTH DAILY AT BEDTIME, Disp: 30 tablet, Rfl: 1  EXAM:  VITALS per patient if applicable:  GENERAL: alert, oriented, appears well and in no acute distress  HEENT: atraumatic, conjunttiva clear, no obvious abnormalities on inspection of external nose and ears  NECK:  normal movements of the head and neck  LUNGS: on inspection no signs of respiratory distress, breathing rate appears normal, no obvious gross SOB, gasping or wheezing  CV: no obvious cyanosis  MS: moves all visible extremities without noticeable abnormality  PSYCH/NEURO: pleasant and cooperative, no obvious depression or anxiety, speech and thought processing grossly intact  ASSESSMENT AND PLAN:  Discussed the following assessment and plan:  Stress  Anxiety  -we discussed possible serious and likely etiologies, options for evaluation and workup, limitations of telemedicine visit vs in person visit, treatment, treatment risks and precautions. Pt prefers to treat via telemedicine empirically rather than in person at this moment.  Discussed various treatment options, both pharmacological and nonpharmacological, for stress and anxiety.  He reports that he does not really like to take medications.  Opted to try [facility], number provided for him to call to schedule.  He is considering benzodiazepines to use on occasion, prefers that to a daily medication.  Did let him know that these medications are restricted with telemedicine visit protocols; would need to follow-up through PCP office or see psychiatrist if he chooses to go that route.  He was understanding. Advised to seek prompt in person care if worsening, new symptoms arise, or if is not improving with treatment. Discussed options for inperson care if PCP office not available. Did let this patient know that I only do telemedicine on Tuesdays and Thursdays for [place]. Advised to schedule follow up visit with PCP or [facility] if any further questions or concerns to avoid delays in care.   I discussed the assessment and treatment  plan with the patient. The patient was provided an opportunity to ask questions and all were answered. The patient agreed with the plan and demonstrated an understanding of the instructions.     Lucretia Kern,  DO

## 2021-02-10 NOTE — Telephone Encounter (Signed)
Please advise messages below patient having concerns about anxiety would like temporary medications for this asking for virtual visit. Would this be okay or should patient schedule office visit?

## 2021-02-18 ENCOUNTER — Other Ambulatory Visit: Payer: Self-pay

## 2021-02-18 ENCOUNTER — Ambulatory Visit (INDEPENDENT_AMBULATORY_CARE_PROVIDER_SITE_OTHER): Admitting: Family Medicine

## 2021-02-18 ENCOUNTER — Encounter: Payer: Self-pay | Admitting: Family Medicine

## 2021-02-18 VITALS — BP 126/78 | HR 74 | Temp 97.7°F | Ht 72.0 in | Wt 201.8 lb

## 2021-02-18 DIAGNOSIS — F418 Other specified anxiety disorders: Secondary | ICD-10-CM | POA: Diagnosis not present

## 2021-02-18 MED ORDER — ALPRAZOLAM 0.5 MG PO TABS: 0.5000 mg | ORAL_TABLET | Freq: Every day | ORAL | 0 refills | Status: DC | PRN

## 2021-02-18 NOTE — Progress Notes (Signed)
Antonio Graham is a 55 y.o. male  Chief Complaint  Patient presents with  . Anxiety    Patient here to discuss temporary medications to help with anxiety due to most recent diagnoses and procedures he have had.    HPI: Antonio Graham is a 55 y.o. male patient of Dr. Ethelene Hal who is seen today to discuss stress related to recent medical diagnoses - papillary thyroid carcinoma, s/p total thyroidectomy and  Radioactive iodine, new dx of 4.5cm thoracic aortic aneursym.  He runs regularly (25 miles per week), trying yoga with wife.  He has h/o insomnia, takes [place] CR 6.25mg  which was effective until the past few months. He wakes up at night and mind 2-3 days per week pt is having trouble with worrying, trouble relaxing.  Would like to try PRN med. Would also consider [facility].    GAD 7 : Generalized Anxiety Score 02/18/2021 08/03/2020  Nervous, Anxious, on Edge 3 0  Control/stop worrying 3 0  Worry too much - different things 2 0  Trouble relaxing 2 0  Restless 0 0  Easily annoyed or irritable 0 0  Afraid - awful might happen 2 0  Total GAD 7 Score 12 0  Anxiety Difficulty Not difficult at all Not difficult at all    Depression screen [facility] 2/9 02/18/2021 02/18/2021 08/03/2020  Decreased Interest 0 1 0  Down, Depressed, Hopeless 0 - 0  PHQ - 2 Score 0 1 0  Altered sleeping 0 - 2  Tired, decreased energy 0 - 1  Change in appetite 0 - 1  Feeling bad or failure about yourself  1 - 0  Trouble concentrating 1 - 0  Moving slowly or fidgety/restless 0 - 0  Suicidal thoughts 0 - 0  PHQ-9 Score 2 - 4  Difficult doing work/chores Not difficult at all - Not difficult at all      No past medical history on file.  Past Surgical History:  Procedure Laterality Date  . EYE SURGERY    . HERNIA REPAIR    . thumb surgery Right   . TONSILLECTOMY      Social History   Socioeconomic History  . Marital status: Married    Spouse name: Not on file  . Number of children: Not on file  .  Years of education: Not on file  . Highest education level: Not on file  Occupational History  . Not on file  Tobacco Use  . Smoking status: Never Smoker  . Smokeless tobacco: Never Used  Vaping Use  . Vaping Use: Never used  Substance and Sexual Activity  . Alcohol use: Yes    Alcohol/week: 2.0 standard drinks    Types: 1 Cans of beer, 1 Shots of liquor per week  . Drug use: Never  . Sexual activity: Yes  Other Topics Concern  . Not on file  Social History Narrative  . Not on file   Social Determinants of Health   Financial Resource Strain: Not on file  Food Insecurity: Not on file  Transportation Needs: Not on file  Physical Activity: Not on file  Stress: Not on file  Social Connections: Not on file  Intimate Partner Violence: Not on file    Family History  Problem Relation Age of Onset  . Healthy Mother   . Healthy Father   . Thyroid disease Neg Hx      Immunization History  Administered Date(s) Administered  . PFIZER(Purple Top)SARS-COV-2 Vaccination 12/09/2019, 12/31/2019, 11/24/2020  . Tdap  10/01/2016    Outpatient Encounter Medications as of 02/18/2021  Medication Sig  . ALPRAZolam (XANAX) 0.5 MG tablet Take 1 tablet (0.5 mg total) by mouth daily as needed for anxiety.  Marland Kitchen levothyroxine (SYNTHROID) 137 MCG tablet Take by mouth.  . Multiple Vitamin (MULTI VITAMIN MENS) tablet Take 1 tablet by mouth daily.  Marland Kitchen zolpidem (AMBIEN CR) 6.25 MG CR tablet TAKE 1 TABLET BY MOUTH DAILY AT BEDTIME  . Cholecalciferol 50 MCG (2000 UT) CAPS Take 1 capsule by mouth daily.   No facility-administered encounter medications on file as of 02/18/2021.     ROS: Pertinent positives and negatives noted in HPI. Remainder of ROS non-contributory    No Known Allergies  BP 126/78   Pulse 74   Temp 97.7 F (36.5 C) (Temporal)   Ht 6' (1.829 m)   Wt 201 lb 12.8 oz (91.5 kg)   SpO2 98%   BMI 27.37 kg/m   Wt Readings from Last 3 Encounters:  02/18/21 201 lb 12.8 oz (91.5 kg)   11/24/20 209 lb 6.4 oz (95 kg)  10/20/20 209 lb (94.8 kg)   Temp Readings from Last 3 Encounters:  02/18/21 97.7 F (36.5 C) (Temporal)  08/03/20 97.6 F (36.4 C) (Tympanic)   BP Readings from Last 3 Encounters:  02/18/21 126/78  11/24/20 110/70  10/20/20 128/80   Pulse Readings from Last 3 Encounters:  02/18/21 74  11/24/20 63  10/20/20 83     Physical Exam Constitutional:      General: He is not in acute distress.    Appearance: Normal appearance. He is not ill-appearing.  Pulmonary:     Effort: No respiratory distress.  Neurological:     Mental Status: He is alert and oriented to person, place, and time.  Psychiatric:        Mood and Affect: Mood normal.        Behavior: Behavior normal.      A/P:  1. Anxiety about health - GAD-7 = 12 (moderate) - stress and anxiety mainly related to multiple new medical dx in the past few months (see HPI) Rx: - ALPRAZolam (XANAX) 0.5 MG tablet; Take 1 tablet (0.5 mg total) by mouth daily as needed for anxiety.  Dispense: 30 tablet; Refill: 0 - goal is for pt to take 2-3x/wk - controlled substance database reviewed and appropriate  - agree with consideration of [place] counseling - pt agrees to f/u if symptoms increase or he is taking xanax daily Discussed plan and reviewed medications with patient, including risks, benefits, and potential side effects. Pt expressed understand. All questions answered.   This visit occurred during the SARS-CoV-2 public health emergency.  Safety protocols were in place, including screening questions prior to the visit, additional usage of staff PPE, and extensive cleaning of exam room while observing appropriate contact time as indicated for disinfecting solutions.

## 2021-02-23 ENCOUNTER — Ambulatory Visit (INDEPENDENT_AMBULATORY_CARE_PROVIDER_SITE_OTHER): Admitting: Endocrinology

## 2021-02-23 ENCOUNTER — Other Ambulatory Visit: Payer: Self-pay

## 2021-02-23 VITALS — BP 110/80 | HR 58 | Ht 72.0 in | Wt 202.2 lb

## 2021-02-23 DIAGNOSIS — E890 Postprocedural hypothyroidism: Secondary | ICD-10-CM | POA: Diagnosis not present

## 2021-02-23 DIAGNOSIS — C73 Malignant neoplasm of thyroid gland: Secondary | ICD-10-CM | POA: Diagnosis not present

## 2021-02-23 LAB — CBC WITH DIFFERENTIAL/PLATELET
Basophils Absolute: 0 10*3/uL (ref 0.0–0.1)
Basophils Relative: 0.5 % (ref 0.0–3.0)
Eosinophils Absolute: 0.1 10*3/uL (ref 0.0–0.7)
Eosinophils Relative: 1.4 % (ref 0.0–5.0)
HCT: 43.2 % (ref 39.0–52.0)
Hemoglobin: 14.9 g/dL (ref 13.0–17.0)
Lymphocytes Relative: 35.6 % (ref 12.0–46.0)
Lymphs Abs: 1.6 10*3/uL (ref 0.7–4.0)
MCHC: 34.5 g/dL (ref 30.0–36.0)
MCV: 84.4 fl (ref 78.0–100.0)
Monocytes Absolute: 0.5 10*3/uL (ref 0.1–1.0)
Monocytes Relative: 10.9 % (ref 3.0–12.0)
Neutro Abs: 2.4 10*3/uL (ref 1.4–7.7)
Neutrophils Relative %: 51.6 % (ref 43.0–77.0)
Platelets: 213 10*3/uL (ref 150.0–400.0)
RBC: 5.11 Mil/uL (ref 4.22–5.81)
RDW: 15.7 % — ABNORMAL HIGH (ref 11.5–15.5)
WBC: 4.6 10*3/uL (ref 4.0–10.5)

## 2021-02-23 LAB — T4, FREE: Free T4: 0.97 ng/dL (ref 0.60–1.60)

## 2021-02-23 LAB — TSH: TSH: 1.92 u[IU]/mL (ref 0.35–4.50)

## 2021-02-23 NOTE — Progress Notes (Signed)
Subjective:    Patient ID: Antonio Graham, male    DOB: [birth year], 55 y.o.   MRN: [id number]  HPI Pt returns for f/u of PTC, with this chronology: 11/21 thyroidect.  Pathol: multifocal PTC, largest 3.1 CM, with 7/7 pos nodes. 1/22 RAI, 128 mCi 1/22 Post-rx scan: uptake at mediastinum and bilat pelvis 1/22 MRI pelvis: no tumor seen 2/22 Chest CT: no tumor seen pt states he feels well in general.  He takes synthroid as rx'ed.   No past medical history on file.  Past Surgical History:  Procedure Laterality Date  . EYE SURGERY    . HERNIA REPAIR    . thumb surgery Right   . TONSILLECTOMY      Social History   Socioeconomic History  . Marital status: Married    Spouse name: Not on file  . Number of children: Not on file  . Years of education: Not on file  . Highest education level: Not on file  Occupational History  . Not on file  Tobacco Use  . Smoking status: Never Smoker  . Smokeless tobacco: Never Used  Vaping Use  . Vaping Use: Never used  Substance and Sexual Activity  . Alcohol use: Yes    Alcohol/week: 2.0 standard drinks    Types: 1 Cans of beer, 1 Shots of liquor per week  . Drug use: Never  . Sexual activity: Yes  Other Topics Concern  . Not on file  Social History Narrative  . Not on file   Social Determinants of Health   Financial Resource Strain: Not on file  Food Insecurity: Not on file  Transportation Needs: Not on file  Physical Activity: Not on file  Stress: Not on file  Social Connections: Not on file  Intimate Partner Violence: Not on file    Current Outpatient Medications on File Prior to Visit  Medication Sig Dispense Refill  . ALPRAZolam (XANAX) 0.5 MG tablet Take 1 tablet (0.5 mg total) by mouth daily as needed for anxiety. 30 tablet 0  . Cholecalciferol 50 MCG (2000 UT) CAPS Take 1 capsule by mouth daily.    . Multiple Vitamin (MULTI VITAMIN MENS) tablet Take 1 tablet by mouth daily.    Marland Kitchen zolpidem (AMBIEN CR) 6.25 MG CR tablet  TAKE 1 TABLET BY MOUTH DAILY AT BEDTIME 30 tablet 1   No current facility-administered medications on file prior to visit.    No Known Allergies  Family History  Problem Relation Age of Onset  . Healthy Mother   . Healthy Father   . Thyroid disease Neg Hx     BP 110/80 (BP Location: Right Arm, Patient Position: Sitting, Cuff Size: Normal)   Pulse (!) 58   Ht 6' (1.829 m)   Wt 202 lb 3.2 oz (91.7 kg)   SpO2 97%   BMI 27.42 kg/m     Review of Systems Denies sob and hip pain    Objective:   Physical Exam VITAL SIGNS:  See vs page GENERAL: no distress Neck: a healed scar is present.  I do not appreciate a nodule in the thyroid or elsewhere in the neck   Lab Results  Component Value Date   TSH 1.92 02/23/2021      Assessment & Plan:  PTC: pt requests to check TG now, despite short time since RAI Hypothyroidism: in view of the above, he needs increased rx.  I have sent a prescription to your pharmacy, to increase synthroid.   Patient Instructions  Blood  tests are requested for you today.  We'll let you know about the results.  Please come back for a follow-up appointment in 2 months.

## 2021-02-23 NOTE — Patient Instructions (Signed)
Blood tests are requested for you today.  We'll let you know about the results.  Please come back for a follow-up appointment in 2 months.   

## 2021-02-24 LAB — THYROGLOBULIN LEVEL: Thyroglobulin: 0.4 ng/mL — ABNORMAL LOW

## 2021-02-24 LAB — THYROGLOBULIN ANTIBODY: Thyroglobulin Ab: <1 IU/mL (ref <=1)

## 2021-02-24 MED ORDER — LEVOTHYROXINE SODIUM 150 MCG PO TABS: 150.0000 ug | ORAL_TABLET | Freq: Every day | ORAL | 3 refills | Status: DC

## 2021-03-02 ENCOUNTER — Encounter: Admitting: Surgery

## 2021-03-09 ENCOUNTER — Other Ambulatory Visit: Payer: Self-pay | Admitting: Family

## 2021-04-05 ENCOUNTER — Encounter: Payer: Self-pay | Admitting: Family Medicine

## 2021-04-05 DIAGNOSIS — Z792 Long term (current) use of antibiotics: Secondary | ICD-10-CM

## 2021-04-05 MED ORDER — AMOXICILLIN 500 MG PO CAPS: ORAL_CAPSULE | ORAL | 0 refills | Status: DC

## 2021-04-12 ENCOUNTER — Telehealth: Payer: Self-pay | Admitting: Endocrinology

## 2021-04-12 NOTE — Telephone Encounter (Signed)
OK 

## 2021-04-12 NOTE — Telephone Encounter (Signed)
Routed to Jessie to reschedule pt

## 2021-04-12 NOTE — Telephone Encounter (Signed)
Please advise 

## 2021-04-12 NOTE — Telephone Encounter (Signed)
Pt's wife is calling regarding app on 04/28/2021. States that her husband will be gone for a [employer] trip for work. And is wanting to know if he can be seen after he gets back either May  13th-15th  after 3pm would work.   If possible call patient's wife.

## 2021-04-28 ENCOUNTER — Ambulatory Visit: Admitting: Endocrinology

## 2021-04-29 ENCOUNTER — Other Ambulatory Visit: Payer: Self-pay

## 2021-04-29 ENCOUNTER — Ambulatory Visit (INDEPENDENT_AMBULATORY_CARE_PROVIDER_SITE_OTHER): Admitting: Endocrinology

## 2021-04-29 VITALS — BP 120/70 | HR 60 | Ht 72.0 in | Wt 200.2 lb

## 2021-04-29 DIAGNOSIS — E890 Postprocedural hypothyroidism: Secondary | ICD-10-CM

## 2021-04-29 DIAGNOSIS — C73 Malignant neoplasm of thyroid gland: Secondary | ICD-10-CM

## 2021-04-29 NOTE — Patient Instructions (Signed)
Blood tests are requested for you today.  We'll let you know about the results.  Let's recheck the scan.  you will receive a phone call, about a day and time for an appointment.  If these tests are good, Please come back for a follow-up appointment in 4 months.

## 2021-04-29 NOTE — Progress Notes (Signed)
Subjective:    Patient ID: Antonio Graham, male    DOB: August 30, 1966, 55 y.o.   MRN: [id number]  HPI Pt returns for f/u of PTC, stage 4, with this chronology: 11/21 thyroidect.  Pathol: multifocal PTC, largest 3.1 CM, with 7/7 pos nodes. 1/22 RAI, 128 mCi 1/22 Post-rx scan: uptake at mediastinum and bilat pelvis.   1/22 MRI pelvis: no tumor seen 2/22 Chest CT: no tumor seen 3/22 TG=0.4 (Ab neg).  pt states he feels well in general, except for fatigue.   He takes synthroid as rx'ed.  Past Surgical History:  Procedure Laterality Date  . EYE SURGERY    . HERNIA REPAIR    . thumb surgery Right   . TONSILLECTOMY      Social History   Socioeconomic History  . Marital status: Married    Spouse name: Not on file  . Number of children: Not on file  . Years of education: Not on file  . Highest education level: Not on file  Occupational History  . Not on file  Tobacco Use  . Smoking status: Never Smoker  . Smokeless tobacco: Never Used  Vaping Use  . Vaping Use: Never used  Substance and Sexual Activity  . Alcohol use: Yes    Alcohol/week: 2.0 standard drinks    Types: 1 Cans of beer, 1 Shots of liquor per week  . Drug use: Never  . Sexual activity: Yes  Other Topics Concern  . Not on file  Social History Narrative  . Not on file   Social Determinants of Health   Financial Resource Strain: Not on file  Food Insecurity: Not on file  Transportation Needs: Not on file  Physical Activity: Not on file  Stress: Not on file  Social Connections: Not on file  Intimate Partner Violence: Not on file    Current Outpatient Medications on File Prior to Visit  Medication Sig Dispense Refill  . ALPRAZolam (XANAX) 0.5 MG tablet Take 1 tablet (0.5 mg total) by mouth daily as needed for anxiety. 30 tablet 0  . amoxicillin (AMOXIL) 500 MG capsule TAKE 4 TABLETS 45 MINUTES PRIOR TO DENTAL WORK. 12 capsule 0  . Cholecalciferol 50 MCG (2000 UT) CAPS Take 1 capsule by mouth daily.    Marland Kitchen  levothyroxine (SYNTHROID) 150 MCG tablet Take 1 tablet (150 mcg total) by mouth daily. 90 tablet 3  . Multiple Vitamin (MULTI VITAMIN MENS) tablet Take 1 tablet by mouth daily.    Marland Kitchen zolpidem (AMBIEN CR) 6.25 MG CR tablet TAKE 1 TABLET BY MOUTH DAILY AT BEDTIME 30 tablet 1   No current facility-administered medications on file prior to visit.    No Known Allergies  Family History  Problem Relation Age of Onset  . Healthy Mother   . Healthy Father   . Thyroid disease Neg Hx     BP 120/70 (BP Location: Right Arm, Patient Position: Sitting, Cuff Size: Normal)   Pulse 60   Ht 6' (1.829 m)   Wt 200 lb 3.2 oz (90.8 kg)   SpO2 96%   BMI 27.15 kg/m    Review of Systems     Objective:   Physical Exam VITAL SIGNS:  See vs page GENERAL: no distress Neck: a healed scar is present.  I do not appreciate a nodule in the thyroid or elsewhere in the neck  Lab Results  Component Value Date   TSH 1.54 04/29/2021      Assessment & Plan:  PTC, stage 4.  Hypothyroidism: in view of the above, he needs increased rx.  Increase synthroid to 175/d.    Patient Instructions  Blood tests are requested for you today.  We'll let you know about the results.  Let's recheck the scan.  you will receive a phone call, about a day and time for an appointment.  If these tests are good, Please come back for a follow-up appointment in 4 months.

## 2021-04-30 MED ORDER — LEVOTHYROXINE SODIUM 175 MCG PO TABS: 175.0000 ug | ORAL_TABLET | Freq: Every day | ORAL | 3 refills | Status: DC

## 2021-05-02 LAB — TSH: TSH: 1.54 mIU/L (ref 0.40–4.50)

## 2021-05-02 LAB — THYROGLOBULIN LEVEL: Thyroglobulin: 0.2 ng/mL — ABNORMAL LOW

## 2021-05-02 LAB — T4, FREE: Free T4: 1.4 ng/dL (ref 0.8–1.8)

## 2021-05-02 LAB — THYROGLOBULIN ANTIBODY: Thyroglobulin Ab: <1 IU/mL (ref <=1)

## 2021-05-06 ENCOUNTER — Other Ambulatory Visit: Payer: Self-pay | Admitting: Family

## 2021-05-09 ENCOUNTER — Encounter (HOSPITAL_COMMUNITY)
Admission: RE | Admit: 2021-05-09 | Discharge: 2021-05-09 | Disposition: A | Discharge status: Home / Self Care | Source: Ambulatory Visit | Attending: Endocrinology | Admitting: Endocrinology

## 2021-05-09 ENCOUNTER — Other Ambulatory Visit: Payer: Self-pay

## 2021-05-09 ENCOUNTER — Other Ambulatory Visit (HOSPITAL_COMMUNITY): Payer: Self-pay

## 2021-05-09 ENCOUNTER — Ambulatory Visit (HOSPITAL_COMMUNITY)
Admission: RE | Admit: 2021-05-09 | Discharge: 2021-05-09 | Disposition: A | Discharge status: Home / Self Care | Source: Ambulatory Visit | Attending: Endocrinology | Admitting: Endocrinology

## 2021-05-09 ENCOUNTER — Encounter (HOSPITAL_COMMUNITY): Payer: Self-pay

## 2021-05-09 DIAGNOSIS — C73 Malignant neoplasm of thyroid gland: Secondary | ICD-10-CM

## 2021-05-09 MED ORDER — THYROTROPIN ALFA 0.9 MG IM SOLR
0.9000 mg | INTRAMUSCULAR | Status: AC
  Administered 2021-05-09 – 2021-05-10 (×2): 0.9 mg via INTRAMUSCULAR

## 2021-05-09 MED ORDER — THYROTROPIN ALFA 0.9 MG IM SOLR
INTRAMUSCULAR | Status: AC
  Filled 2021-05-09: qty 0.9

## 2021-05-09 NOTE — Telephone Encounter (Signed)
Pt is out now

## 2021-05-10 ENCOUNTER — Encounter (HOSPITAL_COMMUNITY)
Admission: RE | Admit: 2021-05-10 | Discharge: 2021-05-10 | Disposition: A | Discharge status: Home / Self Care | Source: Ambulatory Visit | Attending: Endocrinology | Admitting: Endocrinology

## 2021-05-10 ENCOUNTER — Encounter (HOSPITAL_COMMUNITY): Payer: Self-pay

## 2021-05-10 ENCOUNTER — Encounter (HOSPITAL_COMMUNITY)

## 2021-05-10 ENCOUNTER — Telehealth: Payer: Self-pay

## 2021-05-10 DIAGNOSIS — C73 Malignant neoplasm of thyroid gland: Secondary | ICD-10-CM | POA: Diagnosis not present

## 2021-05-10 MED ORDER — THYROTROPIN ALFA 0.9 MG IM SOLR
INTRAMUSCULAR | Status: AC
  Filled 2021-05-10: qty 0.9

## 2021-05-10 MED ORDER — THYROTROPIN ALFA 0.9 MG IM SOLR
0.9000 mg | INTRAMUSCULAR | Status: AC
  Administered 2021-05-10: 0.9 mg via INTRAMUSCULAR

## 2021-05-10 NOTE — Telephone Encounter (Signed)
Pt calling requesting refill/ Pt has been out for 2 days. Last OV 02/18/21 Last fill 02/18/21 #30/1

## 2021-05-11 ENCOUNTER — Encounter (HOSPITAL_COMMUNITY)
Admission: RE | Admit: 2021-05-11 | Discharge: 2021-05-11 | Disposition: A | Discharge status: Home / Self Care | Source: Ambulatory Visit | Attending: Endocrinology | Admitting: Endocrinology

## 2021-05-11 ENCOUNTER — Encounter (HOSPITAL_COMMUNITY)

## 2021-05-11 ENCOUNTER — Encounter (HOSPITAL_COMMUNITY): Payer: Self-pay

## 2021-05-11 ENCOUNTER — Other Ambulatory Visit: Payer: Self-pay

## 2021-05-11 DIAGNOSIS — C73 Malignant neoplasm of thyroid gland: Secondary | ICD-10-CM | POA: Diagnosis not present

## 2021-05-11 MED ORDER — SODIUM IODIDE I 131 CAPSULE: 4.0000 | Freq: Once | INTRAVENOUS | Status: DC | PRN

## 2021-05-11 MED ORDER — SODIUM IODIDE I 131 CAPSULE
4.3000 | Freq: Once | INTRAVENOUS | Status: AC | PRN
  Administered 2021-05-11: 4.3 via ORAL

## 2021-05-12 NOTE — Telephone Encounter (Signed)
Tried calling patient to schedule an appointment and inform that requested rx was sent in. No answer unable to leave a message sent message via Mychart asking patient to call to schedule appointment.

## 2021-05-12 NOTE — Telephone Encounter (Signed)
Pt called in tears stating pt is not sleeping and undergoing treatments and injections and really needs his Ambien. Please try to fill today.  [place], [place] Phone:  [phone]  Fax:  [phone]

## 2021-05-12 NOTE — Telephone Encounter (Signed)
Needs office visit.

## 2021-05-13 ENCOUNTER — Encounter (HOSPITAL_COMMUNITY)
Admission: RE | Admit: 2021-05-13 | Discharge: 2021-05-13 | Disposition: A | Discharge status: Home / Self Care | Source: Ambulatory Visit | Attending: Endocrinology | Admitting: Endocrinology

## 2021-05-13 ENCOUNTER — Other Ambulatory Visit: Payer: Self-pay

## 2021-05-13 DIAGNOSIS — C73 Malignant neoplasm of thyroid gland: Secondary | ICD-10-CM | POA: Diagnosis not present

## 2021-05-16 ENCOUNTER — Encounter: Payer: Self-pay | Admitting: Endocrinology

## 2021-05-17 NOTE — Telephone Encounter (Signed)
Please advise 

## 2021-06-07 ENCOUNTER — Ambulatory Visit: Admitting: Endocrinology

## 2021-06-08 ENCOUNTER — Other Ambulatory Visit: Payer: Self-pay | Admitting: Family

## 2021-06-09 MED ORDER — ZOLPIDEM TARTRATE ER 6.25 MG PO TBCR: 6.2500 mg | EXTENDED_RELEASE_TABLET | Freq: Every evening | ORAL | 0 refills | Status: DC | PRN

## 2021-06-09 NOTE — Telephone Encounter (Signed)
Last OV 02/18/21 Last fill 05/12/21 #30/0

## 2021-06-30 ENCOUNTER — Encounter: Admitting: Family Medicine

## 2021-07-05 ENCOUNTER — Other Ambulatory Visit: Payer: Self-pay | Admitting: Family Medicine

## 2021-07-07 MED ORDER — ZOLPIDEM TARTRATE ER 6.25 MG PO TBCR: 6.2500 mg | EXTENDED_RELEASE_TABLET | Freq: Every evening | ORAL | 0 refills | Status: DC | PRN

## 2021-07-19 ENCOUNTER — Ambulatory Visit (INDEPENDENT_AMBULATORY_CARE_PROVIDER_SITE_OTHER): Admitting: Family Medicine

## 2021-07-19 ENCOUNTER — Encounter: Payer: Self-pay | Admitting: Family Medicine

## 2021-07-19 ENCOUNTER — Other Ambulatory Visit: Payer: Self-pay

## 2021-07-19 VITALS — BP 104/68 | HR 66 | Temp 97.4°F | Ht 72.0 in | Wt 191.0 lb

## 2021-07-19 DIAGNOSIS — F439 Reaction to severe stress, unspecified: Secondary | ICD-10-CM | POA: Insufficient documentation

## 2021-07-19 DIAGNOSIS — F5101 Primary insomnia: Secondary | ICD-10-CM | POA: Insufficient documentation

## 2021-07-19 DIAGNOSIS — M25561 Pain in right knee: Secondary | ICD-10-CM

## 2021-07-19 DIAGNOSIS — F418 Other specified anxiety disorders: Secondary | ICD-10-CM

## 2021-07-19 DIAGNOSIS — E7800 Pure hypercholesterolemia, unspecified: Secondary | ICD-10-CM

## 2021-07-19 DIAGNOSIS — R4589 Other symptoms and signs involving emotional state: Secondary | ICD-10-CM | POA: Insufficient documentation

## 2021-07-19 MED ORDER — ZOLPIDEM TARTRATE ER 6.25 MG PO TBCR: 6.2500 mg | EXTENDED_RELEASE_TABLET | Freq: Every evening | ORAL | 1 refills | Status: DC | PRN

## 2021-07-19 NOTE — Progress Notes (Signed)
Established Patient Office Visit  Subjective:  Patient ID: Antonio Graham, male    DOB: 26-Jul-1966  Age: 55 y.o. MRN: [id number]  CC:  Chief Complaint  Patient presents with   Annual Exam    CPE, discuss temporary use of Ambien, some right knee issues that come and go. Fasting for labs.     HPI Antonio Graham presents for follow-up of insomnia.  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  Recent history of papillary thyroid carcinoma status post thyroidectomy with near 0 levels of thyroglobulin and thyroid antibody.  Continues follow-up with endocrinology.  Ascending aortic aneurysm is under surveillance.  He does not smoke.  He has tried other medications for sleep including Lunesta.  Lunesta gave him sour taste in his mouth.  Has never had an issue with depression but does worry things.  Developed right knee pain after running a 5K and mowing his 2 acre lawn.  No specific history of injury.  It does not lock or give way.  No past medical history on file.  Past Surgical History:  Procedure Laterality Date   EYE SURGERY     HERNIA REPAIR     thumb surgery Right    TONSILLECTOMY      Family History  Problem Relation Age of Onset   Healthy Mother    Healthy Father    Thyroid disease Neg Hx     Social History   Socioeconomic History   Marital status: Married    Spouse name: Not on file   Number of children: Not on file   Years of education: Not on file   Highest education level: Not on file  Occupational History   Not on file  Tobacco Use   Smoking status: Never   Smokeless tobacco: Never  Vaping Use   Vaping Use: Never used  Substance and Sexual Activity   Alcohol use: Yes    Alcohol/week: 2.0 standard drinks    Types: 1 Cans of beer, 1 Shots of liquor per week   Drug use: Never   Sexual activity: Yes  Other Topics Concern   Not on file   Social History Narrative   Not on file   Social Determinants of Health   Financial Resource Strain: Not on file  Food Insecurity: Not on file  Transportation Needs: Not on file  Physical Activity: Not on file  Stress: Not on file  Social Connections: Not on file  Intimate Partner Violence: Not on file    Outpatient Medications Prior to Visit  Medication Sig Dispense Refill   amoxicillin (AMOXIL) 500 MG capsule TAKE 4 TABLETS 45 MINUTES PRIOR TO DENTAL WORK. 12 capsule 0   Cholecalciferol 50 MCG (2000 UT) CAPS Take 1 capsule by mouth daily.     levothyroxine (SYNTHROID) 175 MCG tablet Take 1 tablet (175 mcg total) by mouth daily. 90 tablet 3   Multiple Vitamin (MULTI VITAMIN MENS) tablet Take 1 tablet by mouth daily.     zolpidem (AMBIEN CR) 6.25 MG CR tablet Take 1 tablet (6.25 mg total) by mouth at bedtime as needed. 15 tablet 0   ALPRAZolam (XANAX) 0.5 MG tablet Take 1 tablet (0.5 mg total) by mouth daily as needed for anxiety. (Patient not taking: Reported on 07/19/2021) 30 tablet 0   No facility-administered medications prior to visit.    No Known Allergies  ROS Review  of Systems  Constitutional: Negative.   Respiratory: Negative.    Cardiovascular: Negative.   Gastrointestinal: Negative.   Genitourinary: Negative.   Musculoskeletal:  Positive for arthralgias and gait problem.  Psychiatric/Behavioral:  Positive for sleep disturbance. Negative for dysphoric mood. The patient is nervous/anxious.      Objective:    Physical Exam Vitals and nursing note reviewed.  Constitutional:      General: He is not in acute distress.    Appearance: Normal appearance. He is not ill-appearing, toxic-appearing or diaphoretic.  HENT:     Head: Normocephalic and atraumatic.     Right Ear: External ear normal.     Left Ear: External ear normal.  Eyes:     General:        Right eye: No discharge.        Left eye: No discharge.     Conjunctiva/sclera: Conjunctivae normal.   Pulmonary:     Effort: Pulmonary effort is normal.  Musculoskeletal:     Right knee: No swelling, deformity or effusion. Normal range of motion. Tenderness present over the medial joint line.     Comments: Negative grind.    Neurological:     Mental Status: He is alert and oriented to person, place, and time.  Psychiatric:        Mood and Affect: Mood normal.        Behavior: Behavior normal.    BP 104/68 (BP Location: Left Arm, Patient Position: Sitting, Cuff Size: Normal)   Pulse 66   Temp (!) 97.4 F (36.3 C) (Temporal)   Ht 6' (1.829 m)   Wt 191 lb (86.6 kg)   SpO2 98%   BMI 25.90 kg/m  Wt Readings from Last 3 Encounters:  07/19/21 191 lb (86.6 kg)  04/29/21 200 lb 3.2 oz (90.8 kg)  02/23/21 202 lb 3.2 oz (91.7 kg)     Health Maintenance Due  Topic Date Due   Pneumococcal Vaccine 90-8 Years old (1 - PCV) Never done   HIV Screening  Never done   Hepatitis C Screening  Never done   Zoster Vaccines- Shingrix (1 of 2) Never done   COVID-19 Vaccine (4 - Booster for Pfizer series) 02/22/2021   INFLUENZA VACCINE  07/18/2021    There are no preventive care reminders to display for this patient.  Lab Results  Component Value Date   TSH 1.54 04/29/2021   Lab Results  Component Value Date   WBC 4.6 02/23/2021   HGB 14.9 02/23/2021   HCT 43.2 02/23/2021   MCV 84.4 02/23/2021   PLT 213.0 02/23/2021   Lab Results  Component Value Date   NA 139 12/13/2020   K 4.4 12/13/2020   CO2 27 12/13/2020   GLUCOSE 107 (H) 12/13/2020   BUN 17 12/13/2020   CREATININE 1.19 12/13/2020   BILITOT 0.4 08/03/2020   ALKPHOS 58 08/03/2020   AST 22 08/03/2020   ALT 20 08/03/2020   PROT 7.1 08/03/2020   ALBUMIN 4.4 08/03/2020   CALCIUM 10.2 12/13/2020   ANIONGAP 9 12/13/2020   GFR 62.78 08/03/2020   Lab Results  Component Value Date   CHOL 233 (H) 08/03/2020   Lab Results  Component Value Date   HDL 50.20 08/03/2020   Lab Results  Component Value Date   LDLCALC 153  (H) 08/03/2020   Lab Results  Component Value Date   TRIG 146.0 08/03/2020   Lab Results  Component Value Date   CHOLHDL 5 08/03/2020   No results  found for: HGBA1C    Assessment & Plan:   Problem List Items Addressed This Visit       Other   Acute pain of right knee   Relevant Orders   Ambulatory referral to Sports Medicine   DG Knee Complete 4 Views Right   Primary insomnia   Relevant Medications   zolpidem (AMBIEN CR) 6.25 MG CR tablet   Stress   Anxiety about health - Primary   Elevated LDL cholesterol level    Meds ordered this encounter  Medications   zolpidem (AMBIEN CR) 6.25 MG CR tablet    Sig: Take 1 tablet (6.25 mg total) by mouth at bedtime as needed.    Dispense:  90 tablet    Refill:  1    Follow-up: Return in about 3 months (around 10/19/2021), or return fasting for physical..  Patient with long-term use of Ambien.  He has had no aberrancy with this medication.  Discussed why but increased risk of cancer, thinning of the bones and again sleep aberrancy.  Advised using also an SSRI for management of anxiety.  He declines that for now.  Asked him to think about it.  Also given information on preventing high cholesterol.  LDL was elevated at last check.  Libby Maw, MD

## 2021-07-20 ENCOUNTER — Ambulatory Visit (INDEPENDENT_AMBULATORY_CARE_PROVIDER_SITE_OTHER)
Admission: RE | Admit: 2021-07-20 | Discharge: 2021-07-20 | Disposition: A | Discharge status: Home / Self Care | Source: Ambulatory Visit | Attending: Family Medicine | Admitting: Family Medicine

## 2021-07-20 DIAGNOSIS — M25561 Pain in right knee: Secondary | ICD-10-CM | POA: Diagnosis not present

## 2021-08-15 ENCOUNTER — Telehealth: Payer: Self-pay

## 2021-08-15 ENCOUNTER — Encounter: Payer: Self-pay | Admitting: Orthopedic Surgery

## 2021-08-15 ENCOUNTER — Other Ambulatory Visit: Payer: Self-pay

## 2021-08-15 ENCOUNTER — Ambulatory Visit (INDEPENDENT_AMBULATORY_CARE_PROVIDER_SITE_OTHER): Admitting: Orthopedic Surgery

## 2021-08-15 DIAGNOSIS — S83241A Other tear of medial meniscus, current injury, right knee, initial encounter: Secondary | ICD-10-CM | POA: Diagnosis not present

## 2021-08-15 DIAGNOSIS — M25561 Pain in right knee: Secondary | ICD-10-CM | POA: Diagnosis not present

## 2021-08-15 NOTE — Telephone Encounter (Signed)
Dr Marlou Sa saw patient this afternoon. He would like the MRI of the right knee that he ordered to be changed to urgent please. Thanks.

## 2021-08-15 NOTE — Progress Notes (Signed)
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  He has had pain for the last 8 weeks.  He ran a 5K July 4 weekend and did a lot of yard work.  The next day he was very sore and swollen.  The swelling has improved.  He does report new onset mechanical symptoms as well as focal medial pain.  He has tried Motrin Tylenol and Voltaren gel.  He does have GI intolerance issues to anti-inflammatories.  He usually runs about 20 miles a week but has not been able to run since this swelling.  No prior injury or surgery.  He was very active playing football in college and high school.  He has also tried ice TENS unit stretching as well as a rigorous home routine of exercise and strengthening none of which have helped.  He reports pain on a daily basis as well as pain which interferes with his work and activities of daily living              ROS: All systems reviewed are negative as they relate to the chief complaint within the history of present illness.  Patient denies  fevers or chills.   Assessment & Plan: Visit Diagnoses:  1. Right knee pain, unspecified chronicity   2. Acute medial meniscal tear, right, initial encounter     Plan: Impression is right knee pain with mild effusion over 2 months out from injury and focal medial joint line tenderness.  No arthritis on plain radiographs.  Statistically this is a medial meniscal tear which has failed conservative management.  Recommend MRI scanning to confirm with subsequent follow-up after the study.  Follow-Up Instructions: Return for after MRI.   Orders:  Orders Placed This Encounter  Procedures   MR Knee Right  w/o contrast   No orders of the defined types were placed in this encounter.     Procedures: No procedures performed   Clinical Data: No additional findings.  Objective: Vital Signs: There were no vitals taken for this visit.  Physical Exam:   Constitutional: Patient appears well-developed HEENT:  Head: Normocephalic Eyes:EOM are normal Neck: Normal range of motion Cardiovascular: Normal rate Pulmonary/chest: Effort normal Neurologic: Patient is alert Skin: Skin is warm Psychiatric: Patient has normal mood and affect   Ortho Exam: Ortho exam demonstrates normal gait alignment.  Excellent muscle tone in bilateral lower extremities.  Pedal pulses palpable.  Range of motion is full on the right and left knee.  Collateral and cruciate ligaments are stable on the right.  Mild effusion is present on the right not on the left.  Does have medial joint line tenderness with no lateral joint line tenderness.  Very mild patellofemoral crepitus bilaterally.  No groin pain with internal and external rotation of the right leg and left leg.  Specialty Comments:  No specialty comments available.  Imaging: No results found.   PMFS History: Patient Active Problem List   Diagnosis Date Noted   Acute pain of right knee 07/19/2021   Primary insomnia 07/19/2021   Stress 07/19/2021  Anxiety about health 07/19/2021   Elevated LDL cholesterol level 07/19/2021   Ascending aortic aneurysm ([place]) 01/31/2021   Hypothyroidism 11/27/2020   Papillary thyroid carcinoma ([place]) 11/24/2020   Monocular diplopia of both eyes 10/02/2016   Regular astigmatism of both eyes 10/02/2016   Varicose veins of bilateral lower extremities with pain 05/11/2016   History reviewed. No pertinent past medical history.  Family History  Problem Relation Age of Onset   Healthy Mother    Healthy Father    Thyroid disease Neg Hx     Past Surgical History:  Procedure Laterality Date   EYE SURGERY     HERNIA REPAIR      thumb surgery Right    TONSILLECTOMY     Social History   Occupational History   Not on file  Tobacco Use   Smoking status: Never   Smokeless tobacco: Never  Vaping Use   Vaping Use: Never used  Substance and Sexual Activity   Alcohol use: Yes    Alcohol/week: 2.0 standard drinks    Types: 1 Cans of beer, 1 Shots of liquor per week   Drug use: Never   Sexual activity: Yes

## 2021-08-16 NOTE — Telephone Encounter (Signed)
Order has been changed to URGENT

## 2021-08-17 ENCOUNTER — Ambulatory Visit
Admission: RE | Admit: 2021-08-17 | Discharge: 2021-08-17 | Disposition: A | Discharge status: Home / Self Care | Source: Ambulatory Visit | Attending: Orthopedic Surgery | Admitting: Orthopedic Surgery

## 2021-08-17 ENCOUNTER — Other Ambulatory Visit: Payer: Self-pay

## 2021-08-17 DIAGNOSIS — M25561 Pain in right knee: Secondary | ICD-10-CM

## 2021-08-18 DIAGNOSIS — I351 Nonrheumatic aortic (valve) insufficiency: Secondary | ICD-10-CM | POA: Insufficient documentation

## 2021-08-20 IMAGING — CT CT CHEST W/O CM
2 of 4 series · 15 of 36 positions shown, 18 images · non-contrast
Comparison: None. Findings are correlated with radio iodide
scintigraphy of 12/23/2020.

CLINICAL DATA: Mediastinal adenopathy

EXAM:
CT CHEST WITHOUT CONTRAST
TECHNIQUE: Multidetector CT imaging of the chest was performed following the
standard protocol without IV contrast.

[Series 2: thorax · axial · 0.73mm/px · z∈[+145,+413]mm · 12 of 160 slices shown, 15 images]
[im 13/160  mediastinal]
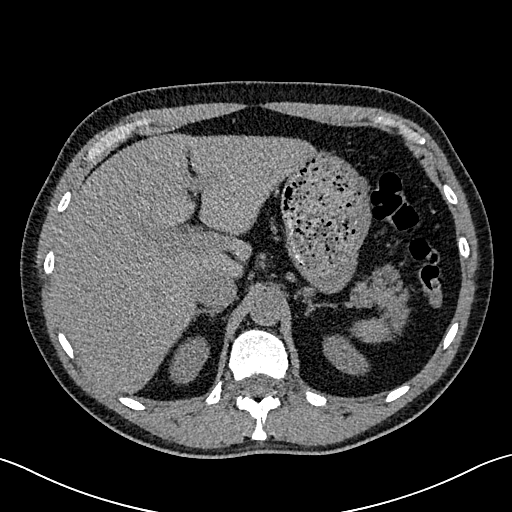
[im 13/160  lung]
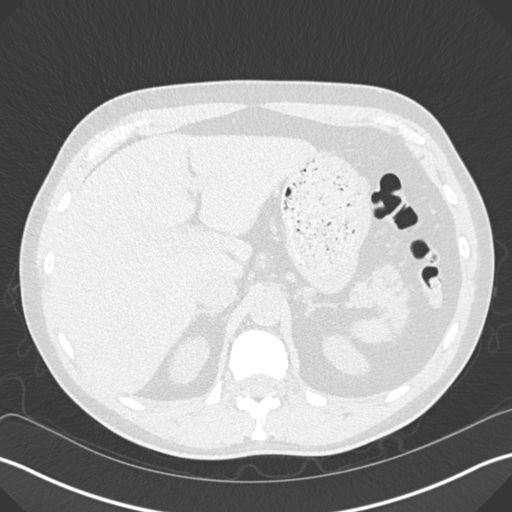
[im 25/160  lung]
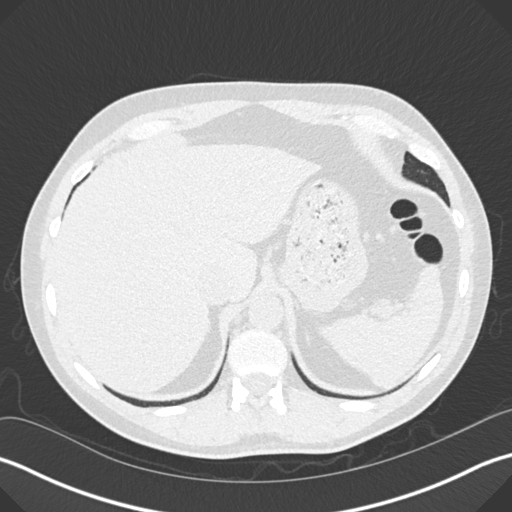
[im 37/160  lung]
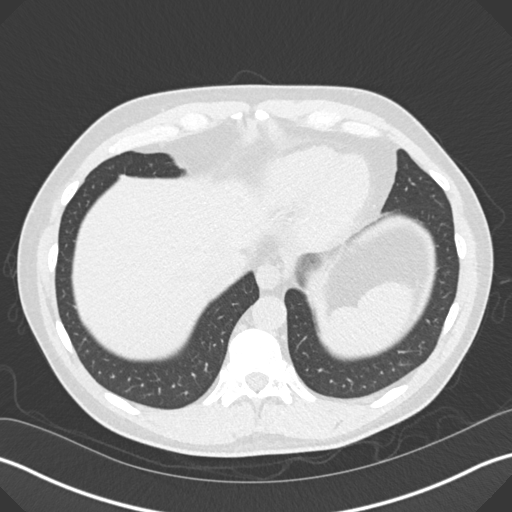
[im 49/160  lung]
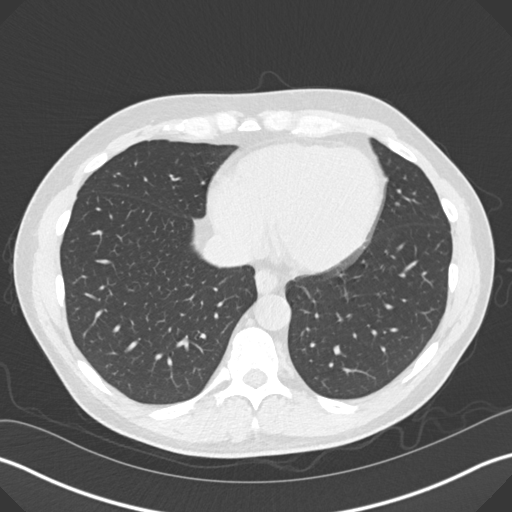
[im 62/160  mediastinal]
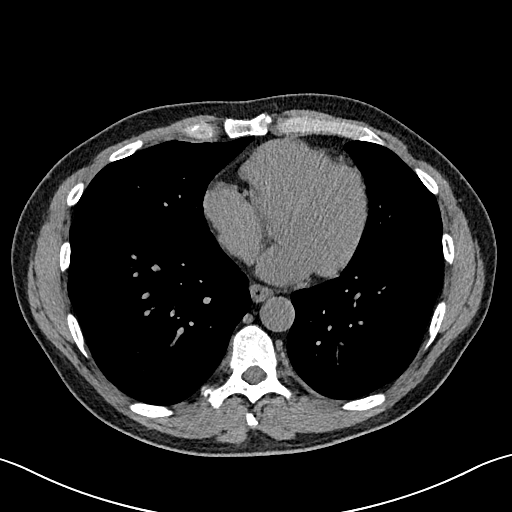
[im 62/160  lung]
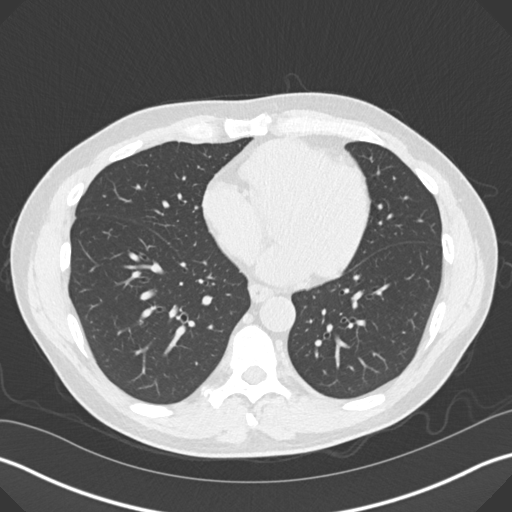
[im 74/160  lung]
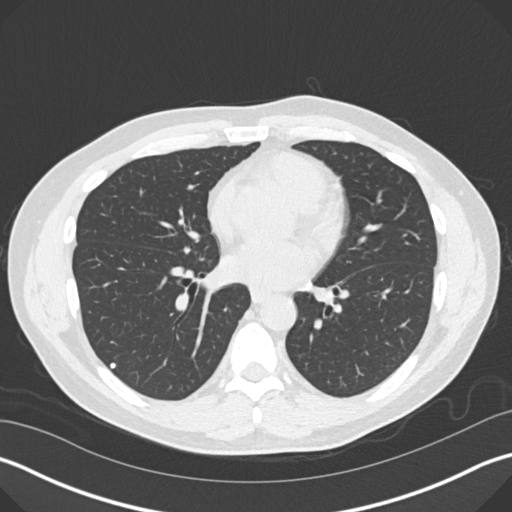
[im 86/160  lung]
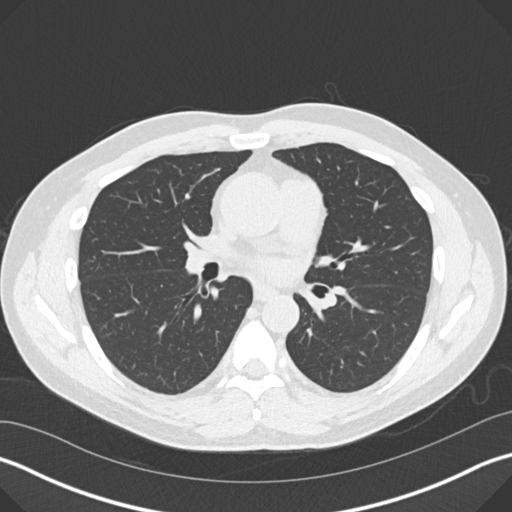
[im 98/160  lung]
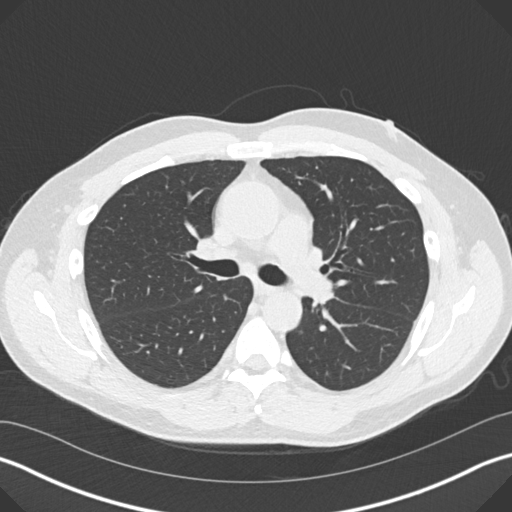
[im 111/160  mediastinal]
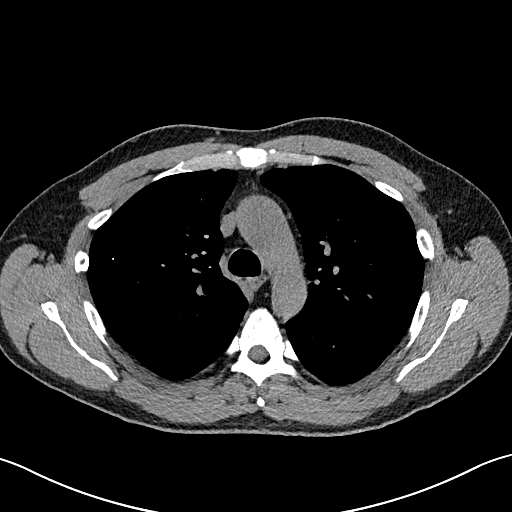
[im 111/160  lung]
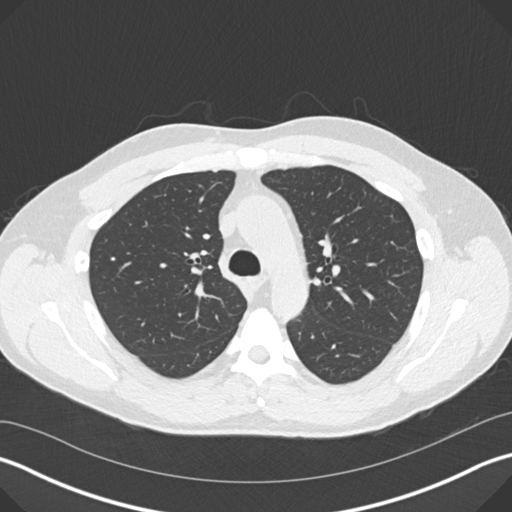
[im 123/160  lung]
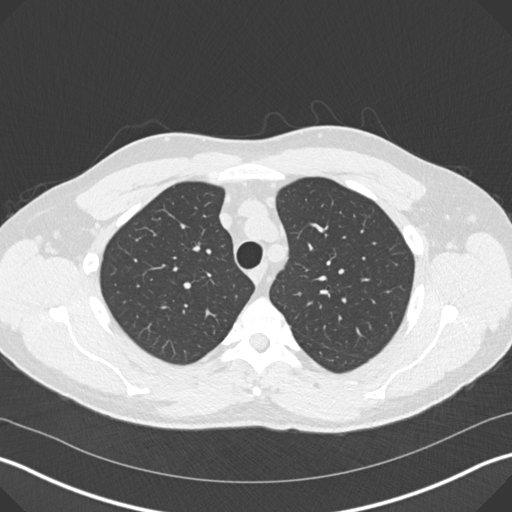
[im 135/160  lung]
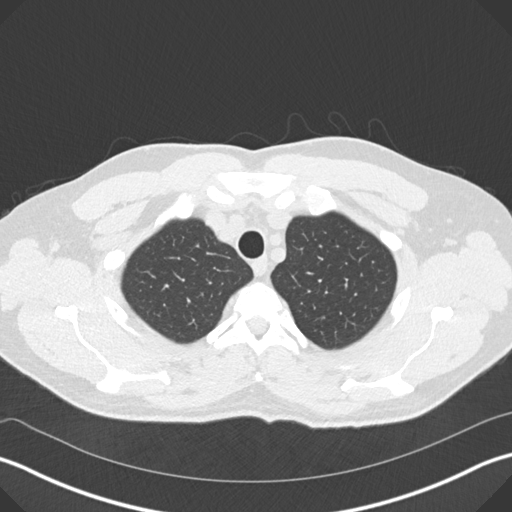
[im 147/160  lung]
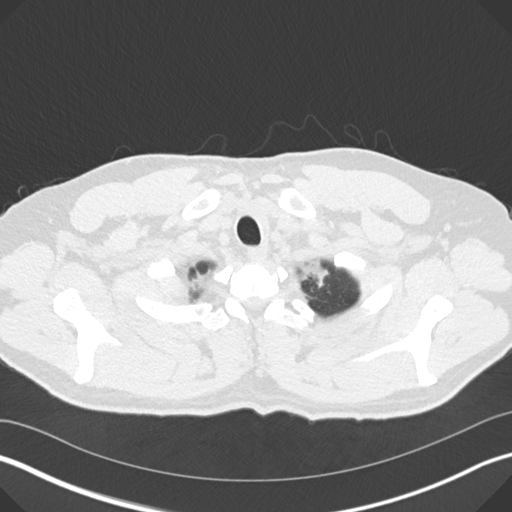

[Series 6: coronal · coronal · 0.64mm/px · 3 of 130 slices shown]
[im 26/130  lung]
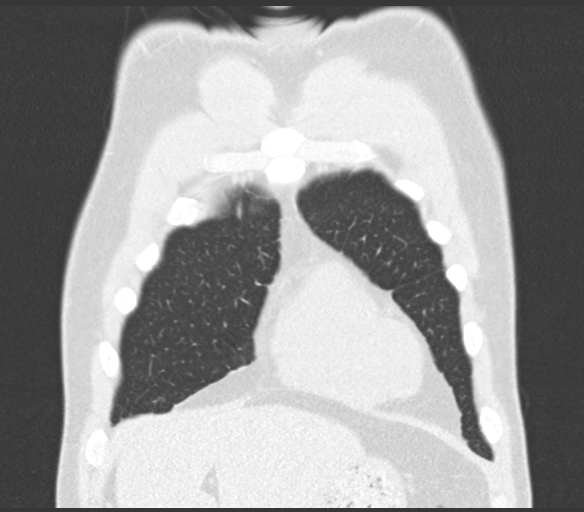
[im 52/130  lung]
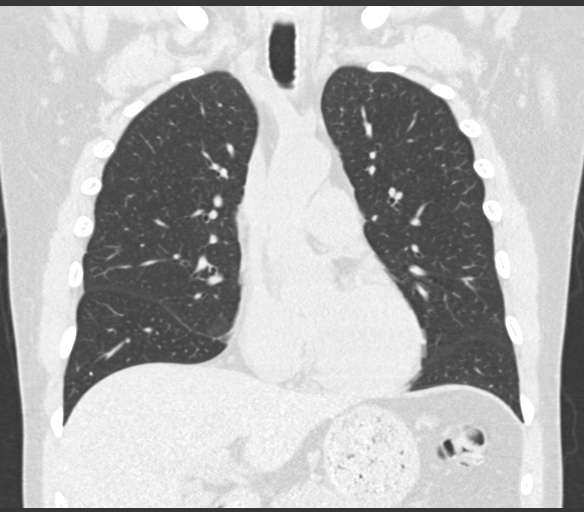
[im 78/130  lung]
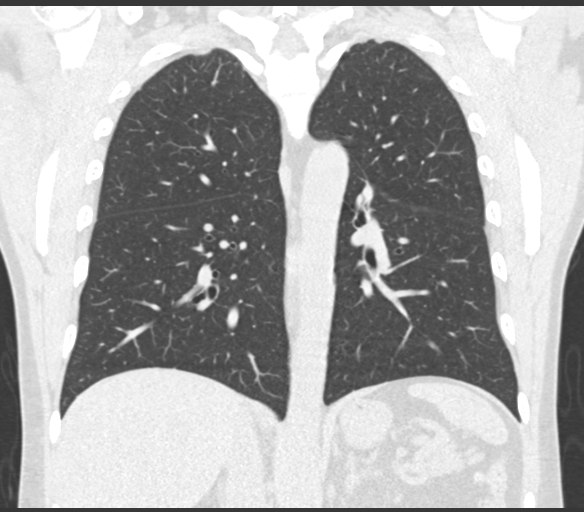

[15 of 36 positions shown; findings below may reference images not displayed]

FINDINGS: Cardiovascular: The ascending aorta is dilated measuring 4.5 cm in
greatest dimension. The descending thoracic aorta is of normal
caliber measuring 2.7 cm in the arch and within the distal
descending segment at the level of the left atrium. No significant
coronary artery calcification. Global cardiac size within normal
limits. No pericardial effusion. The central pulmonary arteries are
of normal caliber.

Mediastinum/Nodes: The thyroid gland is atrophic in keeping with
history of a radio iodide ablation. There is no pathologic thoracic
adenopathy. No mediastinal mass identified. The esophagus is
unremarkable.

Lungs/Pleura: Benign calcified granuloma within the right lower
lobe. The lungs are otherwise clear. No pneumothorax or pleural
effusion. The central airways are widely patent.

Upper Abdomen: No acute abnormality.

Musculoskeletal: No lytic or blastic bone lesion. No acute bone
abnormality.
IMPRESSION: No evidence of intrathoracic metastatic disease.

Ascending thoracic aortic aneurysm with maximal diameter 4.5 cm.
Recommend semi-annual imaging followup by CTA or MRA and referral to
cardiothoracic surgery if not already obtained. This recommendation
follows 9939 ACCF/AHA/AATS/ACR/ASA/SCA/NOMASIBULELE/ALBERT KOFI/ELIE/TIGER Guidelines
for the Diagnosis and Management of Patients With Thoracic Aortic
Disease. Circulation. 9939; 121: E266-e369. Aortic aneurysm NOS
([id number])

## 2021-08-23 ENCOUNTER — Telehealth: Payer: Self-pay | Admitting: Orthopedic Surgery

## 2021-08-23 ENCOUNTER — Encounter: Payer: Self-pay | Admitting: Orthopedic Surgery

## 2021-08-23 NOTE — Telephone Encounter (Signed)
Called pt 1X on vm to call and set appt with Dr. Marlou Sa for MRI review after 8/31

## 2021-08-23 NOTE — Progress Notes (Signed)
I called lmom when is his f/u?Marland Kitchen

## 2021-08-24 NOTE — Telephone Encounter (Signed)
I called and left message on his machine about someone will call him within the next day or 2 about his surgery date thanks

## 2021-08-30 ENCOUNTER — Ambulatory Visit (INDEPENDENT_AMBULATORY_CARE_PROVIDER_SITE_OTHER): Admitting: Endocrinology

## 2021-08-30 ENCOUNTER — Other Ambulatory Visit: Payer: Self-pay

## 2021-08-30 ENCOUNTER — Telehealth: Payer: Self-pay | Admitting: Orthopedic Surgery

## 2021-08-30 VITALS — BP 122/80 | HR 68 | Ht 72.0 in | Wt 198.2 lb

## 2021-08-30 DIAGNOSIS — C73 Malignant neoplasm of thyroid gland: Secondary | ICD-10-CM | POA: Diagnosis not present

## 2021-08-30 LAB — BASIC METABOLIC PANEL
BUN: 20 mg/dL (ref 6–23)
CO2: 28 mEq/L (ref 19–32)
Calcium: 9.8 mg/dL (ref 8.4–10.5)
Chloride: 101 mEq/L (ref 96–112)
Creatinine, Ser: 0.89 mg/dL (ref 0.40–1.50)
GFR: 97.58 mL/min (ref >60.00)
Glucose, Bld: 79 mg/dL (ref 70–99)
Potassium: 4 mEq/L (ref 3.5–5.1)
Sodium: 138 mEq/L (ref 135–145)

## 2021-08-30 LAB — TSH: TSH: 0.93 u[IU]/mL (ref 0.35–5.50)

## 2021-08-30 LAB — T4, FREE: Free T4: 0.9 ng/dL (ref 0.60–1.60)

## 2021-08-30 MED ORDER — LEVOTHYROXINE SODIUM 200 MCG PO TABS: 200.0000 ug | ORAL_TABLET | Freq: Every day | ORAL | 3 refills | Status: DC

## 2021-08-30 NOTE — Progress Notes (Signed)
Subjective:    Patient ID: Antonio Graham, male    DOB: Jan 10, 1966, 55 y.o.   MRN: [id number]  HPI Pt returns for f/u of PTC, stage 4, with this chronology: 11/21 thyroidect.  Pathol: multifocal PTC, largest 3.1 CM, with 7/7 pos nodes.   1/22 RAI, 128 mCi 1/22 Post-rx scan: uptake at mediastinum and bilat pelvis.   1/22 MRI pelvis: no tumor seen 2/22 Chest CT: no tumor seen 3/22 TG=0.4 (Ab neg).  5/22 Tg=0.2 (Ab neg) 5/22 total body I-131 scan: resolution of all abnormal uptake.   8/22 chest CT: no tumor seen pt states he feels well in general, except for fatigue.   He takes synthroid as rx'ed.   No past medical history on file.  Past Surgical History:  Procedure Laterality Date   EYE SURGERY     HERNIA REPAIR     thumb surgery Right    TONSILLECTOMY      Social History   Socioeconomic History   Marital status: Married    Spouse name: Not on file   Number of children: Not on file   Years of education: Not on file   Highest education level: Not on file  Occupational History   Not on file  Tobacco Use   Smoking status: Never   Smokeless tobacco: Never  Vaping Use   Vaping Use: Never used  Substance and Sexual Activity   Alcohol use: Yes    Alcohol/week: 2.0 standard drinks    Types: 1 Cans of beer, 1 Shots of liquor per week   Drug use: Never   Sexual activity: Yes  Other Topics Concern   Not on file  Social History Narrative   Not on file   Social Determinants of Health   Financial Resource Strain: Not on file  Food Insecurity: Not on file  Transportation Needs: Not on file  Physical Activity: Not on file  Stress: Not on file  Social Connections: Not on file  Intimate Partner Violence: Not on file    Current Outpatient Medications on File Prior to Visit  Medication Sig Dispense Refill   amoxicillin (AMOXIL) 500 MG capsule TAKE 4 TABLETS 45 MINUTES PRIOR TO DENTAL WORK. 12 capsule 0   Cholecalciferol 50 MCG (2000 UT) CAPS Take 1 capsule by mouth  daily.     Multiple Vitamin (MULTI VITAMIN MENS) tablet Take 1 tablet by mouth daily.     zolpidem (AMBIEN CR) 6.25 MG CR tablet Take 1 tablet (6.25 mg total) by mouth at bedtime as needed. 90 tablet 1   No current facility-administered medications on file prior to visit.    No Known Allergies  Family History  Problem Relation Age of Onset   Healthy Mother    Healthy Father    Thyroid disease Neg Hx     BP 122/80 (BP Location: Right Arm, Patient Position: Sitting, Cuff Size: Normal)   Pulse 68   Ht 6' (1.829 m)   Wt 198 lb 3.2 oz (89.9 kg)   SpO2 97%   BMI 26.88 kg/m    Review of Systems Denies neck pain, swelling, or lump.    Objective:   Physical Exam Neck: a healed scar is present.  I do not appreciate a nodule in the thyroid or elsewhere in the neck.    Lab Results  Component Value Date   TSH 0.93 08/30/2021       Assessment & Plan:  Stage 4 PTC: recheck labs and [place].   Hypothyroidism: in view of  the above, he needs increased rx.  I have sent a prescription to your pharmacy, to increase.    

## 2021-08-30 NOTE — Telephone Encounter (Signed)
Pt's wife calling to set up his surg appt as soon as possible. The best call back number is [phone].

## 2021-08-30 NOTE — Patient Instructions (Addendum)
Blood tests are requested for you today.  We'll let you know about the results.  Let's recheck the ultrasound  you will receive a phone call, about a day and time for an appointment.  If these tests are good, please come back for a follow-up appointment in 4 months.

## 2021-08-31 LAB — THYROGLOBULIN LEVEL: Thyroglobulin: 0.1 ng/mL — ABNORMAL LOW

## 2021-08-31 LAB — THYROGLOBULIN ANTIBODY: Thyroglobulin Ab: <1 IU/mL (ref <=1)

## 2021-09-04 ENCOUNTER — Other Ambulatory Visit: Payer: Self-pay | Admitting: Surgical

## 2021-09-05 ENCOUNTER — Encounter: Payer: Self-pay | Admitting: Orthopedic Surgery

## 2021-09-05 DIAGNOSIS — S83231D Complex tear of medial meniscus, current injury, right knee, subsequent encounter: Secondary | ICD-10-CM

## 2021-09-05 MED ORDER — CELECOXIB 100 MG PO CAPS
100.0000 mg | ORAL_CAPSULE | Freq: Two times a day (BID) | ORAL | 0 refills | Status: DC
Start: 2021-09-05 — End: 2022-01-05

## 2021-09-05 MED ORDER — OXYCODONE-ACETAMINOPHEN 5-325 MG PO TABS: 1.0000 | ORAL_TABLET | ORAL | 0 refills | Status: DC | PRN

## 2021-09-05 MED ORDER — ASPIRIN 81 MG PO CHEW: 81.0000 mg | CHEWABLE_TABLET | Freq: Every day | ORAL | 0 refills | Status: AC

## 2021-09-05 MED ORDER — METHOCARBAMOL 500 MG PO TABS: 500.0000 mg | ORAL_TABLET | Freq: Three times a day (TID) | ORAL | 0 refills | Status: DC | PRN

## 2021-09-07 ENCOUNTER — Ambulatory Visit: Admitting: Orthopedic Surgery

## 2021-09-12 ENCOUNTER — Other Ambulatory Visit: Payer: Self-pay

## 2021-09-12 ENCOUNTER — Ambulatory Visit (INDEPENDENT_AMBULATORY_CARE_PROVIDER_SITE_OTHER): Admitting: Orthopedic Surgery

## 2021-09-12 DIAGNOSIS — Z9889 Other specified postprocedural states: Secondary | ICD-10-CM

## 2021-09-13 ENCOUNTER — Encounter: Payer: Self-pay | Admitting: Orthopedic Surgery

## 2021-09-13 NOTE — Progress Notes (Signed)
   Post-Op Visit Note   Patient: Antonio Graham           Date of Birth: 1966-01-08           MRN: [id number] Visit Date: 09/12/2021 PCP: Libby Maw, MD   Assessment & Plan:  Chief Complaint:  Chief Complaint  Patient presents with   Right Knee - Routine Post Op    09/05/21 right knee scope with deb   Visit Diagnoses:  1. Status post arthroscopy     Plan: Patient is a 55 year old male who presents s/p right knee arthroscopy with meniscal debridement on 09/05/2021.  He complains of slight stiffness and discomfort mostly with flexion but overall his pain is well controlled on only has to take Celebrex as needed.  He is full weightbearing without crutches.  Taking aspirin daily for DVT prophylaxis.  Denies any chest pain, shortness of breath.  Not have any use any adequate medication aside from the first couple days after surgery.  Denies any fevers, chills, night sweats.  Range of motion from 15 degrees extension to 85 degrees of knee flexion.  Trace effusion present.  Incisions are healing well without evidence of infection or dehiscence.  Sutures removed and replaced with Steri-Strips.  Able to perform straight leg raise.  No calf tenderness.  Negative Homans' sign.  Plan to work on extension primarily and ride stationary bike to work on range of motion.  Excellent quad strength today.  Follow-up in 4 weeks clinical recheck with Dr. Marlou Sa.  Follow-Up Instructions: No follow-ups on file.   Orders:  No orders of the defined types were placed in this encounter.  No orders of the defined types were placed in this encounter.   Imaging: No results found.  PMFS History: Patient Active Problem List   Diagnosis Date Noted   Acute pain of right knee 07/19/2021   Primary insomnia 07/19/2021   Stress 07/19/2021   Anxiety about health 07/19/2021   Elevated LDL cholesterol level 07/19/2021   Ascending aortic aneurysm ([place]) 01/31/2021   Hypothyroidism 11/27/2020    Papillary thyroid carcinoma ([place]) 11/24/2020   Monocular diplopia of both eyes 10/02/2016   Regular astigmatism of both eyes 10/02/2016   Varicose veins of bilateral lower extremities with pain 05/11/2016   No past medical history on file.  Family History  Problem Relation Age of Onset   Healthy Mother    Healthy Father    Thyroid disease Neg Hx     Past Surgical History:  Procedure Laterality Date   EYE SURGERY     HERNIA REPAIR     thumb surgery Right    TONSILLECTOMY     Social History   Occupational History   Not on file  Tobacco Use   Smoking status: Never   Smokeless tobacco: Never  Vaping Use   Vaping Use: Never used  Substance and Sexual Activity   Alcohol use: Yes    Alcohol/week: 2.0 standard drinks    Types: 1 Cans of beer, 1 Shots of liquor per week   Drug use: Never   Sexual activity: Yes

## 2021-09-19 ENCOUNTER — Other Ambulatory Visit: Payer: Self-pay | Admitting: Family Medicine

## 2021-09-23 ENCOUNTER — Ambulatory Visit
Admission: RE | Admit: 2021-09-23 | Discharge: 2021-09-23 | Disposition: A | Discharge status: Home / Self Care | Source: Ambulatory Visit | Attending: Endocrinology | Admitting: Endocrinology

## 2021-09-23 DIAGNOSIS — C73 Malignant neoplasm of thyroid gland: Secondary | ICD-10-CM

## 2021-10-10 ENCOUNTER — Other Ambulatory Visit: Payer: Self-pay

## 2021-10-10 ENCOUNTER — Encounter: Payer: Self-pay | Admitting: Orthopedic Surgery

## 2021-10-10 ENCOUNTER — Ambulatory Visit (INDEPENDENT_AMBULATORY_CARE_PROVIDER_SITE_OTHER): Admitting: Orthopedic Surgery

## 2021-10-10 DIAGNOSIS — S83241A Other tear of medial meniscus, current injury, right knee, initial encounter: Secondary | ICD-10-CM

## 2021-10-13 ENCOUNTER — Encounter: Payer: Self-pay | Admitting: Orthopedic Surgery

## 2021-10-13 NOTE — Progress Notes (Signed)
   Post-Op Visit Note   Patient: Antonio Graham           Date of Birth: [birth year]           MRN: [id number] Visit Date: 10/10/2021 PCP: Libby Maw, MD   Assessment & Plan:  Chief Complaint:  Chief Complaint  Patient presents with   Right Knee - Post-op Follow-up   Visit Diagnoses:  1. Acute medial meniscal tear, right, initial encounter     Plan: Antonio Graham is a 55 year old patient who is 5 weeks out right knee arthroscopy with partial medial meniscectomy.  He has been doing stationary bike about 1 hour a day with no resistance.  Has some occasional achiness but nothing terrible.  He is able to do stairs without difficulty.  Would like to return back to running.  Taking no medication for pain.  On exam he has no effusion excellent range of motion with no joint line tenderness.  Plan at this time is to really work on quad strengthening for the next 6 weeks before he returns to running.  I do not think running is a great idea for this knee but he will have to figure out what his envelope of function is in regards to running.  Follow-up as needed.  Follow-Up Instructions: Return if symptoms worsen or fail to improve.   Orders:  No orders of the defined types were placed in this encounter.  No orders of the defined types were placed in this encounter.   Imaging: No results found.  PMFS History: Patient Active Problem List   Diagnosis Date Noted   Acute pain of right knee 07/19/2021   Primary insomnia 07/19/2021   Stress 07/19/2021   Anxiety about health 07/19/2021   Elevated LDL cholesterol level 07/19/2021   Ascending aortic aneurysm 01/31/2021   Hypothyroidism 11/27/2020   Papillary thyroid carcinoma ([place]) 11/24/2020   Monocular diplopia of both eyes 10/02/2016   Regular astigmatism of both eyes 10/02/2016   Varicose veins of bilateral lower extremities with pain 05/11/2016   History reviewed. No pertinent past medical history.  Family History  Problem  Relation Age of Onset   Healthy Mother    Healthy Father    Thyroid disease Neg Hx     Past Surgical History:  Procedure Laterality Date   EYE SURGERY     HERNIA REPAIR     thumb surgery Right    TONSILLECTOMY     Social History   Occupational History   Not on file  Tobacco Use   Smoking status: Never   Smokeless tobacco: Never  Vaping Use   Vaping Use: Never used  Substance and Sexual Activity   Alcohol use: Yes    Alcohol/week: 2.0 standard drinks    Types: 1 Cans of beer, 1 Shots of liquor per week   Drug use: Never   Sexual activity: Yes

## 2021-12-06 ENCOUNTER — Ambulatory Visit: Admitting: Family Medicine

## 2021-12-20 ENCOUNTER — Ambulatory Visit: Admitting: Family Medicine

## 2022-01-03 ENCOUNTER — Ambulatory Visit: Admitting: Endocrinology

## 2022-01-05 ENCOUNTER — Encounter: Payer: Self-pay | Admitting: Family Medicine

## 2022-01-05 ENCOUNTER — Ambulatory Visit (INDEPENDENT_AMBULATORY_CARE_PROVIDER_SITE_OTHER): Admitting: Family Medicine

## 2022-01-05 ENCOUNTER — Other Ambulatory Visit: Payer: Self-pay

## 2022-01-05 VITALS — BP 110/70 | HR 73 | Temp 97.5°F | Ht 72.0 in | Wt 207.6 lb

## 2022-01-05 DIAGNOSIS — Z0000 Encounter for general adult medical examination without abnormal findings: Secondary | ICD-10-CM | POA: Diagnosis not present

## 2022-01-05 DIAGNOSIS — Z87898 Personal history of other specified conditions: Secondary | ICD-10-CM | POA: Insufficient documentation

## 2022-01-05 DIAGNOSIS — E890 Postprocedural hypothyroidism: Secondary | ICD-10-CM

## 2022-01-05 DIAGNOSIS — Z23 Encounter for immunization: Secondary | ICD-10-CM | POA: Diagnosis not present

## 2022-01-05 DIAGNOSIS — Z1159 Encounter for screening for other viral diseases: Secondary | ICD-10-CM | POA: Diagnosis not present

## 2022-01-05 DIAGNOSIS — Z1211 Encounter for screening for malignant neoplasm of colon: Secondary | ICD-10-CM

## 2022-01-05 DIAGNOSIS — Z125 Encounter for screening for malignant neoplasm of prostate: Secondary | ICD-10-CM

## 2022-01-05 DIAGNOSIS — R7989 Other specified abnormal findings of blood chemistry: Secondary | ICD-10-CM

## 2022-01-05 DIAGNOSIS — Z114 Encounter for screening for human immunodeficiency virus [HIV]: Secondary | ICD-10-CM

## 2022-01-05 LAB — LIPID PANEL
Cholesterol: 249 mg/dL — ABNORMAL HIGH (ref 0–200)
HDL: 53.7 mg/dL (ref >39.00)
LDL Cholesterol: 157 mg/dL — ABNORMAL HIGH (ref 0–99)
NonHDL: 195.09
Total CHOL/HDL Ratio: 5
Triglycerides: 190 mg/dL — ABNORMAL HIGH (ref 0.0–149.0)
VLDL: 38 mg/dL (ref 0.0–40.0)

## 2022-01-05 LAB — PSA: PSA: 0.67 ng/mL (ref 0.10–4.00)

## 2022-01-05 LAB — URINALYSIS, ROUTINE W REFLEX MICROSCOPIC
Bilirubin Urine: NEGATIVE
Hgb urine dipstick: NEGATIVE
Ketones, ur: NEGATIVE
Leukocytes,Ua: NEGATIVE
Nitrite: NEGATIVE
RBC / HPF: NONE SEEN (ref 0–?)
Specific Gravity, Urine: 1.01 (ref 1.000–1.030)
Total Protein, Urine: NEGATIVE
Urine Glucose: NEGATIVE
Urobilinogen, UA: 0.2 (ref 0.0–1.0)
pH: 6 (ref 5.0–8.0)

## 2022-01-05 LAB — CBC
HCT: 45.7 % (ref 39.0–52.0)
Hemoglobin: 15.5 g/dL (ref 13.0–17.0)
MCHC: 34 g/dL (ref 30.0–36.0)
MCV: 86.5 fl (ref 78.0–100.0)
Platelets: 196 10*3/uL (ref 150.0–400.0)
RBC: 5.28 Mil/uL (ref 4.22–5.81)
RDW: 13.9 % (ref 11.5–15.5)
WBC: 7.8 10*3/uL (ref 4.0–10.5)

## 2022-01-05 LAB — COMPREHENSIVE METABOLIC PANEL
ALT: 55 U/L — ABNORMAL HIGH (ref 0–53)
AST: 41 U/L — ABNORMAL HIGH (ref 0–37)
Albumin: 4.8 g/dL (ref 3.5–5.2)
Alkaline Phosphatase: 67 U/L (ref 39–117)
BUN: 18 mg/dL (ref 6–23)
CO2: 27 mEq/L (ref 19–32)
Calcium: 10 mg/dL (ref 8.4–10.5)
Chloride: 101 mEq/L (ref 96–112)
Creatinine, Ser: 1.07 mg/dL (ref 0.40–1.50)
GFR: 78.83 mL/min (ref >60.00)
Glucose, Bld: 88 mg/dL (ref 70–99)
Potassium: 4.1 mEq/L (ref 3.5–5.1)
Sodium: 138 mEq/L (ref 135–145)
Total Bilirubin: 0.8 mg/dL (ref 0.2–1.2)
Total Protein: 7.7 g/dL (ref 6.0–8.3)

## 2022-01-05 LAB — HEMOGLOBIN A1C: Hgb A1c MFr Bld: 5.6 % (ref 4.6–6.5)

## 2022-01-05 NOTE — Progress Notes (Signed)
Established Patient Office Visit  Subjective:  Patient ID: Lui Bellis, male    DOB: [birth year]  Age: 56 y.o. MRN: [id number]  CC:  Chief Complaint  Patient presents with   Annual Exam    CPE, would like referral for colonoscopy. Never had EKG before. Fasting for labs.     HPI Deja Kaigler presents for a complete physical exam.  He has been doing well.  He is starting to exercise again status post knee surgery for torn meniscus.  Continues to work.  Lives at home with his wife and 62 year old daughter who is Engineer, production today.  She may be interested in [employer].  He is having no specific issues.   No past medical history on file.  Past Surgical History:  Procedure Laterality Date   EYE SURGERY     HERNIA REPAIR     thumb surgery Right    TONSILLECTOMY      Family History  Problem Relation Age of Onset   Healthy Mother    Healthy Father    Thyroid disease Neg Hx     Social History   Socioeconomic History   Marital status: Married    Spouse name: Not on file   Number of children: Not on file   Years of education: Not on file   Highest education level: Not on file  Occupational History   Not on file  Tobacco Use   Smoking status: Never   Smokeless tobacco: Never  Vaping Use   Vaping Use: Never used  Substance and Sexual Activity   Alcohol use: Yes    Alcohol/week: 2.0 standard drinks    Types: 1 Cans of beer, 1 Shots of liquor per week   Drug use: Never   Sexual activity: Yes  Other Topics Concern   Not on file  Social History Narrative   Not on file   Social Determinants of Health   Financial Resource Strain: Not on file  Food Insecurity: Not on file  Transportation Needs: Not on file  Physical Activity: Not on file  Stress: Not on file  Social Connections: Not on file  Intimate Partner Violence: Not on file    Outpatient Medications Prior to Visit  Medication Sig Dispense Refill   amoxicillin (AMOXIL) 500 MG capsule TAKE 4 TABLETS 45  MINUTES PRIOR TO DENTAL WORK. 12 capsule 0   Cholecalciferol 50 MCG (2000 UT) CAPS Take 1 capsule by mouth daily.     levothyroxine (SYNTHROID) 200 MCG tablet Take 1 tablet (200 mcg total) by mouth daily. 90 tablet 3   Multiple Vitamin (MULTI VITAMIN MENS) tablet Take 1 tablet by mouth daily.     zolpidem (AMBIEN CR) 6.25 MG CR tablet Take 1 tablet (6.25 mg total) by mouth at bedtime as needed. 90 tablet 1   celecoxib (CELEBREX) 100 MG capsule Take 1 capsule (100 mg total) by mouth 2 (two) times daily. 60 capsule 0   methocarbamol (ROBAXIN) 500 MG tablet Take 1 tablet (500 mg total) by mouth every 8 (eight) hours as needed. 30 tablet 0   oxyCODONE-acetaminophen (PERCOCET) 5-325 MG tablet Take 1 tablet by mouth every 4 (four) hours as needed for severe pain. 20 tablet 0   No facility-administered medications prior to visit.    No Known Allergies  ROS Review of Systems  Constitutional:  Negative for diaphoresis, fatigue, fever and unexpected weight change.  HENT: Negative.    Eyes:  Negative for photophobia and visual disturbance.  Respiratory: Negative.  Cardiovascular: Negative.   Gastrointestinal: Negative.   Endocrine: Negative for polyphagia and polyuria.  Genitourinary:  Negative for difficulty urinating, frequency and urgency.  Musculoskeletal:  Negative for gait problem and joint swelling.  Skin:  Negative for color change and pallor.  Neurological:  Negative for speech difficulty and weakness.  Psychiatric/Behavioral:  Positive for sleep disturbance.   Depression screen [facility] 2/9 01/05/2022 01/05/2022 07/19/2021  Decreased Interest 0 0 0  Down, Depressed, Hopeless 0 0 0  PHQ - 2 Score 0 0 0  Altered sleeping 2 - 2  Tired, decreased energy 0 - 1  Change in appetite 0 - 0  Feeling bad or failure about yourself  0 - 0  Trouble concentrating 0 - 0  Moving slowly or fidgety/restless 0 - 0  Suicidal thoughts 0 - 0  PHQ-9 Score 2 - 3  Difficult doing work/chores Not difficult at  all - Somewhat difficult       Objective:    Physical Exam Vitals and nursing note reviewed.  Constitutional:      General: He is not in acute distress.    Appearance: Normal appearance. He is not ill-appearing, toxic-appearing or diaphoretic.  HENT:     Head: Normocephalic and atraumatic.     Right Ear: Tympanic membrane, ear canal and external ear normal.     Left Ear: Tympanic membrane, ear canal and external ear normal.     Mouth/Throat:     Mouth: Mucous membranes are moist.     Pharynx: Oropharynx is clear. No oropharyngeal exudate or posterior oropharyngeal erythema.  Eyes:     General: No scleral icterus.       Right eye: No discharge.        Left eye: No discharge.     Extraocular Movements: Extraocular movements intact.     Conjunctiva/sclera: Conjunctivae normal.     Pupils: Pupils are equal, round, and reactive to light.  Neck:     Vascular: No carotid bruit.  Cardiovascular:     Rate and Rhythm: Normal rate and regular rhythm.  Pulmonary:     Effort: Pulmonary effort is normal.     Breath sounds: Normal breath sounds.  Abdominal:     General: Abdomen is flat. Bowel sounds are normal. There is no distension.     Palpations: Abdomen is soft. There is no mass.     Tenderness: There is no abdominal tenderness. There is no guarding or rebound.     Hernia: No hernia is present. There is no hernia in the left inguinal area or right inguinal area.  Genitourinary:    Penis: Circumcised. No hypospadias, erythema, tenderness, discharge, swelling or lesions.      Testes:        Right: Mass, tenderness or swelling not present. Right testis is descended.        Left: Mass, tenderness or swelling not present. Left testis is descended.     Epididymis:     Right: Not inflamed or enlarged.     Left: Not inflamed or enlarged.     Prostate: Not enlarged, not tender and no nodules present.     Rectum: Guaiac result negative. No mass, tenderness, anal fissure, external  hemorrhoid or internal hemorrhoid. Normal anal tone.  Musculoskeletal:     Cervical back: No rigidity or tenderness.     Right lower leg: No edema.     Left lower leg: No edema.  Lymphadenopathy:     Cervical: No cervical adenopathy.  Lower Body: No right inguinal adenopathy. No left inguinal adenopathy.  Skin:    General: Skin is warm and dry.  Neurological:     Mental Status: He is alert and oriented to person, place, and time.  Psychiatric:        Mood and Affect: Mood normal.        Behavior: Behavior normal.    BP 110/70 (BP Location: Right Arm, Patient Position: Sitting, Cuff Size: Normal)    Pulse 73    Temp (!) 97.5 F (36.4 C) (Temporal)    Ht 6' (1.829 m)    Wt 207 lb 9.6 oz (94.2 kg)    SpO2 97%    BMI 28.16 kg/m  Wt Readings from Last 3 Encounters:  01/05/22 207 lb 9.6 oz (94.2 kg)  08/30/21 198 lb 3.2 oz (89.9 kg)  07/19/21 191 lb (86.6 kg)     Health Maintenance Due  Topic Date Due   Pneumococcal Vaccine 60-74 Years old (1 - PCV) Never done   Hepatitis C Screening  Never done   Zoster Vaccines- Shingrix (1 of 2) Never done    There are no preventive care reminders to display for this patient.  Lab Results  Component Value Date   TSH 0.93 08/30/2021   Lab Results  Component Value Date   WBC 4.6 02/23/2021   HGB 14.9 02/23/2021   HCT 43.2 02/23/2021   MCV 84.4 02/23/2021   PLT 213.0 02/23/2021   Lab Results  Component Value Date   NA 138 08/30/2021   K 4.0 08/30/2021   CO2 28 08/30/2021   GLUCOSE 79 08/30/2021   BUN 20 08/30/2021   CREATININE 0.89 08/30/2021   BILITOT 0.4 08/03/2020   ALKPHOS 58 08/03/2020   AST 22 08/03/2020   ALT 20 08/03/2020   PROT 7.1 08/03/2020   ALBUMIN 4.4 08/03/2020   CALCIUM 9.8 08/30/2021   ANIONGAP 9 12/13/2020   GFR 97.58 08/30/2021   Lab Results  Component Value Date   CHOL 233 (H) 08/03/2020   Lab Results  Component Value Date   HDL 50.20 08/03/2020   Lab Results  Component Value Date   LDLCALC  153 (H) 08/03/2020   Lab Results  Component Value Date   TRIG 146.0 08/03/2020   Lab Results  Component Value Date   CHOLHDL 5 08/03/2020   No results found for: HGBA1C    Assessment & Plan:   Problem List Items Addressed This Visit       Endocrine   Hypothyroidism - Primary     Other   Need for shingles vaccine   Relevant Orders   Varicella-zoster vaccine IM (Shingrix)   Need for pneumococcal vaccine   Relevant Orders   Pneumococcal conjugate vaccine 20-valent (Prevnar 20)   Screen for colon cancer   Relevant Orders   Ambulatory referral to Gastroenterology   History of palpitations   Relevant Orders   EKG 12-Lead   Other Visit Diagnoses     Healthcare maintenance       Relevant Orders   CBC   Comprehensive metabolic panel   Hemoglobin A1c   Lipid panel   PSA   Urinalysis, Routine w reflex microscopic       No orders of the defined types were placed in this encounter.   Follow-up: Return in about 1 year (around 01/05/2023), or if symptoms worsen or fail to improve, for Return for 2nd Shingrix vaccine in 2-6 months..  EKG today showed normal sinus rhythm with a  rate of 60.  There were no ST segment elevations or depressions.  There were no acute events   Libby Maw, MD

## 2022-01-06 LAB — HEPATITIS C ANTIBODY
Hepatitis C Ab: NONREACTIVE
SIGNAL TO CUT-OFF: 0.17 (ref <1.00)

## 2022-01-06 LAB — HIV ANTIBODY (ROUTINE TESTING W REFLEX): HIV 1&2 Ab, 4th Generation: NONREACTIVE

## 2022-01-09 DIAGNOSIS — R7989 Other specified abnormal findings of blood chemistry: Secondary | ICD-10-CM | POA: Insufficient documentation

## 2022-01-18 ENCOUNTER — Other Ambulatory Visit: Payer: Self-pay

## 2022-01-18 ENCOUNTER — Ambulatory Visit (INDEPENDENT_AMBULATORY_CARE_PROVIDER_SITE_OTHER): Admitting: Endocrinology

## 2022-01-18 VITALS — BP 120/78 | HR 78 | Ht 72.0 in | Wt 208.8 lb

## 2022-01-18 DIAGNOSIS — C73 Malignant neoplasm of thyroid gland: Secondary | ICD-10-CM

## 2022-01-18 NOTE — Progress Notes (Signed)
Subjective:    Patient ID: Antonio Graham, male    DOB: [birth year], 56 y.o.   MRN: [id number]  HPI Pt returns for f/u of PTC, stage 4, with this chronology: 11/21 thyroidect.  Pathol: multifocal PTC, largest 3.1 CM, with 7/7 pos nodes.   1/22 RAI, 128 mCi 1/22 Post-rx scan: uptake at mediastinum and bilat pelvis.    1/22 MRI pelvis: no tumor seen 2/22 Chest CT: no tumor seen 3/22 TG=0.4 (Ab neg).  5/22 TG=0.2 (Ab neg) 5/22 total body I-131 scan: resolution of all abnormal uptake.   8/22 chest CT: no tumor seen.   9/22 TG=0.1 (Ab neg).   10/22 US neck: neg pt states he feels well in general, except for intermitt palpitations.  He has Apple watch, and HR is normal.   He takes synthroid as rx'ed.   No past medical history on file.  Past Surgical History:  Procedure Laterality Date   EYE SURGERY     HERNIA REPAIR     thumb surgery Right    TONSILLECTOMY      Social History   Socioeconomic History   Marital status: Married    Spouse name: Not on file   Number of children: Not on file   Years of education: Not on file   Highest education level: Not on file  Occupational History   Not on file  Tobacco Use   Smoking status: Never   Smokeless tobacco: Never  Vaping Use   Vaping Use: Never used  Substance and Sexual Activity   Alcohol use: Yes    Alcohol/week: 2.0 standard drinks    Types: 1 Cans of beer, 1 Shots of liquor per week   Drug use: Never   Sexual activity: Yes  Other Topics Concern   Not on file  Social History Narrative   Not on file   Social Determinants of Health   Financial Resource Strain: Not on file  Food Insecurity: Not on file  Transportation Needs: Not on file  Physical Activity: Not on file  Stress: Not on file  Social Connections: Not on file  Intimate Partner Violence: Not on file    Current Outpatient Medications on File Prior to Visit  Medication Sig Dispense Refill   amoxicillin (AMOXIL) 500 MG capsule TAKE 4 TABLETS 45 MINUTES  PRIOR TO DENTAL WORK. 12 capsule 0   Cholecalciferol 50 MCG (2000 UT) CAPS Take 1 capsule by mouth daily.     Multiple Vitamin (MULTI VITAMIN MENS) tablet Take 1 tablet by mouth daily.     zolpidem (AMBIEN CR) 6.25 MG CR tablet Take 1 tablet (6.25 mg total) by mouth at bedtime as needed. 90 tablet 1   No current facility-administered medications on file prior to visit.    No Known Allergies  Family History  Problem Relation Age of Onset   Healthy Mother    Healthy Father    Thyroid disease Neg Hx     BP 120/78    Pulse 78    Ht 6' (1.829 m)    Wt 208 lb 12.8 oz (94.7 kg)    SpO2 98%    BMI 28.32 kg/m    Review of Systems     Objective:   Physical Exam VITAL SIGNS:  See vs page. GENERAL: no distress.   Neck: a healed scar is present.  I do not appreciate a nodule in the thyroid or elsewhere in the neck.    Lab Results  Component Value Date   TSH <0.01  Repeated and verified X2. (L) 01/18/2022      Assessment & Plan:  Hypothyroidism: overcontrolled.  I have sent a prescription to your pharmacy, to reduce synthroid PTC: recheck today.

## 2022-01-18 NOTE — Patient Instructions (Addendum)
Blood tests are requested for you today.  We'll let you know about the results.   If the blood test is higher, let's check the nuclear medicine body scan.   If these tests are good, please come back for a follow-up appointment in May.

## 2022-01-19 LAB — TSH: TSH: <0.01 u[IU]/mL — ABNORMAL LOW (ref 0.35–5.50)

## 2022-01-19 LAB — T4, FREE: Free T4: 1.29 ng/dL (ref 0.60–1.60)

## 2022-01-19 LAB — THYROGLOBULIN LEVEL: Thyroglobulin: 0.1 ng/mL — ABNORMAL LOW

## 2022-01-19 LAB — THYROGLOBULIN ANTIBODY: Thyroglobulin Ab: <1 IU/mL (ref <=1)

## 2022-01-19 MED ORDER — LEVOTHYROXINE SODIUM 175 MCG PO TABS: 175.0000 ug | ORAL_TABLET | Freq: Every day | ORAL | 3 refills | Status: AC

## 2022-01-23 ENCOUNTER — Other Ambulatory Visit: Payer: Self-pay | Admitting: Family Medicine

## 2022-01-23 DIAGNOSIS — F5101 Primary insomnia: Secondary | ICD-10-CM

## 2022-02-22 ENCOUNTER — Encounter: Payer: Self-pay | Admitting: Physician Assistant

## 2022-03-10 NOTE — Progress Notes (Signed)
? ? ? ?03/22/2022 ?Antonio Graham ?[id number] ?12-20-1965 ? ? ?ASSESSMENT AND PLAN:  ?Antonio Graham was seen today for colonoscopy, gastroesophageal reflux and dysphagia. ? ?Elevated LFTs ?Had recent weight gain, mildly elevated. ?Has lost weight and has plan to follow up with PCP.  ?Continue weight loss, follow up with PCP.  ? ?Dysphagia, unspecified type ? EGD to evaluate for structural abnormality, tumor, erosive/infectious esophagititis, and EOE.   ?If the EGD is negative can then proceed to barium swallow and/or esophageal manometry. ?I discussed risks of EGD with patient today, including risk of sedation, bleeding or perforation.  ?Patient provides understanding and gave verbal consent to proceed. ? ?Gastroesophageal reflux disease without esophagitis ?he reports symptoms are currently well controlled, and denies breakthrough reflux, burning in chest, hoarseness or cough. ?Lifestyle changes discussed, avoid NSAIDS ? ?Screen for colon cancer ?We have discussed the risks of bleeding, infection, perforation, medication reactions, and remote risk of death associated with colonoscopy. All questions were answered and the patient acknowledges these risk and wishes to proceed. ? ? ?Patient Care Team: ?Libby Maw, MD as PCP - General (Family Medicine) ? ?HISTORY OF PRESENT ILLNESS: ?56 y.o. male referred by Libby Maw,*, with a past medical history of papillary thyroid carcinoma s/p thyroidectomy 2021 and postoperative radioactive iodine ablation, hypothyroidism, elevated LFTs and others listed below presents for evaluation of EGD and colonoscopy.  ?Moved here 2019, has not had colonoscopy.  ?No family history of GI malignancy. ? ?Patient states after thyroidectomy started to have some dysphagia, has improved but states if he eats too quickly can feel irritation of it going down, as needed.  ?GERD only with certain foods, rare tums.  ?Patient denies nausea, vomiting, melena.  ?Patient denies change in  bowel habits, constipation, diarrhea, hematochezia.  ?Denies changes in appetite, unintentional weight loss.  ? ?Has plan to recheck LFts in summer with PCP, has had some weight gain but working on weight loss.  ?1-2 beers on weekend, rare.  ? ?Prefers Dr. Cathleen Corti.  ?Has 4 kids, youngest is graduating this year.  ?Immunologist for [employer]  ? ?CBC  01/05/2022  ?HGB 15.5 MCV 86.5 without evidence of anemia ?WBC 7.8 Platelets 196.0 ?Kidney function 01/05/2022  ?BUN 18 Cr 1.07  ?GFR >60  ?Potassium 4.1   ?LFTs 01/05/2022  ?AST 41 ALT 55 ?Alkphos 67 TBili 0.8 ?01/18/2022 TSH <0.01 Repeated and verified X2. ? ?MRI pelvis with and without contrast 01/06/2021 ?Diverticulosis ? ?Current Medications:  ? ?Current Outpatient Medications (Endocrine & Metabolic):  ?  levothyroxine (SYNTHROID) 175 MCG tablet, Take 1 tablet (175 mcg total) by mouth daily. ? ? ? ? ? ?Current Outpatient Medications (Other):  ?  amoxicillin (AMOXIL) 500 MG capsule, TAKE 4 TABLETS 45 MINUTES PRIOR TO DENTAL WORK. ?  Cholecalciferol 50 MCG (2000 UT) CAPS, Take 1 capsule by mouth daily. ?  Multiple Vitamin (MULTI VITAMIN MENS) tablet, Take 1 tablet by mouth daily. ?  zolpidem (AMBIEN CR) 6.25 MG CR tablet, TAKE 1 TABLET BY MOUTH AT BEDTIME AS NEEDED ? ?Medical History:  ?Past Medical History:  ?Diagnosis Date  ? Congenital heart disease   ? Diverticulosis of colon   ? Thyroid disease   ? ?Allergies: No Known Allergies  ? ?Surgical History:  ?He  has a past surgical history that includes Tonsillectomy; thumb surgery (Right); Eye surgery; Hernia repair; Inguinal hernia repair; and Thyroidectomy. ?Family History:  ?His family history includes Healthy in his father and mother; Skin cancer in his father and mother. ?Social History:  ?  reports that he has never smoked. He has never used smokeless tobacco. He reports current alcohol use of about 2.0 standard drinks per week. He reports that he does not use drugs. ? ?REVIEW OF SYSTEMS  : All other systems reviewed and  negative except where noted in the History of Present Illness. ? ? ?PHYSICAL EXAM: ?BP 112/74   Pulse 73   Ht 6' (1.829 m)   Wt 202 lb 6 oz (91.8 kg)   BMI 27.45 kg/m?  ?General:   Pleasant, well developed male in no acute distress ?Head:  Normocephalic and atraumatic. ?Eyes: sclerae anicteric,conjunctive pink  ?Heart:  regular rate and rhythm ?Pulm: Clear anteriorly; no wheezing ?Abdomen:  Soft, Flat AB, skin exam lipomas appreciated, Normal bowel sounds.  no  tenderness . , . ?Extremities:   edema. ?Msk:  Symmetrical without gross deformities. Peripheral pulses intact.  ?Neurologic:  Alert and  oriented x4;  grossly normal neurologically. ?Skin:   Dry and intact without significant lesions or rashes. ?Psychiatric: Demonstrates good judgement and reason without abnormal affect or behaviors. ? ? ?Vladimir Crofts, PA-C ?8:57 AM ? ? ?

## 2022-03-22 ENCOUNTER — Ambulatory Visit (INDEPENDENT_AMBULATORY_CARE_PROVIDER_SITE_OTHER): Admitting: Physician Assistant

## 2022-03-22 ENCOUNTER — Encounter: Payer: Self-pay | Admitting: Physician Assistant

## 2022-03-22 DIAGNOSIS — Z1211 Encounter for screening for malignant neoplasm of colon: Secondary | ICD-10-CM | POA: Diagnosis not present

## 2022-03-22 DIAGNOSIS — R1310 Dysphagia, unspecified: Secondary | ICD-10-CM | POA: Diagnosis not present

## 2022-03-22 DIAGNOSIS — R7989 Other specified abnormal findings of blood chemistry: Secondary | ICD-10-CM

## 2022-03-22 DIAGNOSIS — K219 Gastro-esophageal reflux disease without esophagitis: Secondary | ICD-10-CM | POA: Diagnosis not present

## 2022-03-22 MED ORDER — GOLYTELY 236 G PO SOLR: 4000.0000 mL | Freq: Once | ORAL | 0 refills | Status: AC

## 2022-03-22 NOTE — Patient Instructions (Addendum)
If you are age 55 or younger, your body mass index should be between 19-25. Your Body mass index is 27.45 kg/m?Marland Kitchen If this is out of the aformentioned range listed, please consider follow up with your Primary Care Provider.  ?________________________________________________________ ? ?The [place] [facility] providers would like to encourage you to use [facility] to communicate with providers for non-urgent requests or questions.  Due to long hold times on the telephone, sending your provider a message by [facility] may be a faster and more efficient way to get a response.  Please allow 48 business hours for a response.  Please remember that this is for non-urgent requests.  ?_______________________________________________________ ? ?You have been scheduled for an endoscopy and colonoscopy. Please follow the written instructions given to you at your visit today. ?Please pick up your prep supplies at the pharmacy within the next 1-3 days. ?If you use inhalers (even only as needed), please bring them with you on the day of your procedure. ? ?Due to recent changes in healthcare laws, you may see the results of your imaging and laboratory studies on MyChart before your provider has had a chance to review them.  We understand that in some cases there may be results that are confusing or concerning to you. Not all laboratory results come back in the same time frame and the provider may be waiting for multiple results in order to interpret others.  Please give [place] 48 hours in order for your provider to thoroughly review all the results before contacting the office for clarification of your results.  ? ?Avoid spicy and acidic foods ?Avoid fatty foods ?Limit your intake of coffee, tea, alcohol, and carbonated drinks ?Work to maintain a healthy weight ?Keep the head of the bed elevated at least 3 inches with blocks or a wedge pillow if you are having any nighttime symptoms ?Stay upright for 2 hours after eating ?Avoid meals and snacks three to  four hours before bedtime ? ?Gastroesophageal Reflux Disease, Adult ?Gastroesophageal reflux (GER) happens when acid from the stomach flows up into the tube that connects the mouth and the stomach (esophagus). Normally, food travels down the esophagus and stays in the stomach to be digested. However, when a person has GER, food and stomach acid sometimes move back up into the esophagus. If this becomes a more serious problem, the person may be diagnosed with a disease called gastroesophageal reflux disease (GERD). GERD occurs when the reflux: ?Happens often. ?Causes frequent or severe symptoms. ?Causes problems such as damage to the esophagus. ?When stomach acid comes in contact with the esophagus, the acid may cause inflammation in the esophagus. Over time, GERD may create small holes (ulcers) in the lining of the esophagus. ?What are the causes? ?This condition is caused by a problem with the muscle between the esophagus and the stomach (lower esophageal sphincter, or LES). Normally, the LES muscle closes after food passes through the esophagus to the stomach. When the LES is weakened or abnormal, it does not close properly, and that allows food and stomach acid to go back up into the esophagus. ?The LES can be weakened by certain dietary substances, medicines, and medical conditions, including: ?Tobacco use. ?Pregnancy. ?Having a hiatal hernia. ?Alcohol use. ?Certain foods and beverages, such as coffee, chocolate, onions, and peppermint. ?What increases the risk? ?You are more likely to develop this condition if you: ?Have an increased body weight. ?Have a connective tissue disorder. ?Take NSAIDs, such as ibuprofen. ?What are the signs or symptoms? ?Symptoms of  this condition include: ?Heartburn. ?Difficult or painful swallowing and the feeling of having a lump in the throat. ?A bitter taste in the mouth. ?Bad breath and having a large amount of saliva. ?Having an upset or bloated stomach and belching. ?Chest  pain. Different conditions can cause chest pain. Make sure you see your health care provider if you experience chest pain. ?Shortness of breath or wheezing. ?Ongoing (chronic) cough or a nighttime cough. ?Wearing away of tooth enamel. ?Weight loss. ?How is this diagnosed? ?This condition may be diagnosed based on a medical history and a physical exam. To determine if you have mild or severe GERD, your health care provider may also monitor how you respond to treatment. You may also have tests, including: ?A test to examine your stomach and esophagus with a small camera (endoscopy). ?A test that measures the acidity level in your esophagus. ?A test that measures how much pressure is on your esophagus. ?A barium swallow or modified barium swallow test to show the shape, size, and functioning of your esophagus. ?How is this treated? ?Treatment for this condition may vary depending on how severe your symptoms are. Your health care provider may recommend: ?Changes to your diet. ?Medicine. ?Surgery. ?The goal of treatment is to help relieve your symptoms and to prevent complications. ?Follow these instructions at home: ?Eating and drinking ? ?Follow a diet as recommended by your health care provider. This may involve avoiding foods and drinks such as: ?Coffee and tea, with or without caffeine. ?Drinks that contain alcohol. ?Energy drinks and sports drinks. ?Carbonated drinks or sodas. ?Chocolate and cocoa. ?Peppermint and mint flavorings. ?Garlic and onions. ?Horseradish. ?Spicy and acidic foods, including peppers, chili powder, curry powder, vinegar, hot sauces, and barbecue sauce. ?Citrus fruit juices and citrus fruits, such as oranges, lemons, and limes. ?Tomato-based foods, such as red sauce, chili, salsa, and pizza with red sauce. ?Fried and fatty foods, such as donuts, french fries, potato chips, and high-fat dressings. ?High-fat meats, such as hot dogs and fatty cuts of red and white meats, such as rib eye steak,  sausage, ham, and bacon. ?High-fat dairy items, such as whole milk, butter, and cream cheese. ?Eat small, frequent meals instead of large meals. ?Avoid drinking large amounts of liquid with your meals. ?Avoid eating meals during the 2-3 hours before bedtime. ?Avoid lying down right after you eat. ?Do not exercise right after you eat. ?Lifestyle ? ?Do not use any products that contain nicotine or tobacco. These products include cigarettes, chewing tobacco, and vaping devices, such as e-cigarettes. If you need help quitting, ask your health care provider. ?Try to reduce your stress by using methods such as yoga or meditation. If you need help reducing stress, ask your health care provider. ?If you are overweight, reduce your weight to an amount that is healthy for you. Ask your health care provider for guidance about a safe weight loss goal. ?General instructions ?Pay attention to any changes in your symptoms. ?Take over-the-counter and prescription medicines only as told by your health care provider. Do not take aspirin, ibuprofen, or other NSAIDs unless your health care provider told you to take these medicines. ?Wear loose-fitting clothing. Do not wear anything tight around your waist that causes pressure on your abdomen. ?Raise (elevate) the head of your bed about 6 inches (15 cm). You can use a wedge to do this. ?Avoid bending over if this makes your symptoms worse. ?Keep all follow-up visits. This is important. ?Contact a health care provider if: ?You have: ?  New symptoms. ?Unexplained weight loss. ?Difficulty swallowing or it hurts to swallow. ?Wheezing or a persistent cough. ?A hoarse voice. ?Your symptoms do not improve with treatment. ?Get help right away if: ?You have sudden pain in your arms, neck, jaw, teeth, or back. ?You suddenly feel sweaty, dizzy, or light-headed. ?You have chest pain or shortness of breath. ?You vomit and the vomit is green, yellow, or black, or it looks like blood or coffee  grounds. ?You faint. ?You have stool that is red, bloody, or black. ?You cannot swallow, drink, or eat. ?These symptoms may represent a serious problem that is an emergency. Do not wait to see if the symptoms will

## 2022-03-30 NOTE — Progress Notes (Signed)
Agree with the assessment and plan as outlined by Amanda Collier, PA-C. ? ?Joseth Weigel, DO, FACG ? ?

## 2022-04-19 ENCOUNTER — Ambulatory Visit (INDEPENDENT_AMBULATORY_CARE_PROVIDER_SITE_OTHER): Admitting: Endocrinology

## 2022-04-19 VITALS — BP 116/82 | HR 89 | Ht 72.0 in | Wt 197.4 lb

## 2022-04-19 DIAGNOSIS — E890 Postprocedural hypothyroidism: Secondary | ICD-10-CM

## 2022-04-19 DIAGNOSIS — C73 Malignant neoplasm of thyroid gland: Secondary | ICD-10-CM

## 2022-04-19 NOTE — Progress Notes (Signed)
? ?Subjective:  ? ? Patient ID: Antonio Graham, male    DOB: [birth year], 56 y.o.   MRN: [id number] ? ?HPI ?Pt returns for f/u of PTC, stage 4, with this chronology: ?11/21 thyroidect.  multifocal PTC, largest 3.1 CM, with 7/7 pos nodes.   ?1/22 RAI, 128 mCi ?1/22 Post-rx scan: uptake at mediastinum and bilat pelvis.   ?1/22 MRI pelvis: no tumor seen ?2/22 Chest CT: no tumor seen ?3/22 TG=0.4 (Ab neg).  ?5/22 TG=0.2 (Ab neg) ?5/22 total body RAI scan: resolution of all abnormal uptake.   ?8/22 chest CT: no tumor.   ?9/22 TG=0.1 (Ab neg).   ?10/22 US neck: neg ?2/23 TG=0.1 (Ab neg) ?pt states he feels well in general.  He takes synthroid as rx'ed.  ?Past Medical History:  ?Diagnosis Date  ? Congenital heart disease   ? Diverticulosis of colon   ? Thyroid disease   ? ? ?Past Surgical History:  ?Procedure Laterality Date  ? EYE SURGERY    ? HERNIA REPAIR    ? INGUINAL HERNIA REPAIR    ? thumb surgery Right   ? THYROIDECTOMY    ? TONSILLECTOMY    ? ? ?Social History  ? ?Socioeconomic History  ? Marital status: Married  ?  Spouse name: Not on file  ? Number of children: Not on file  ? Years of education: Not on file  ? Highest education level: Not on file  ?Occupational History  ? Not on file  ?Tobacco Use  ? Smoking status: Never  ? Smokeless tobacco: Never  ?Vaping Use  ? Vaping Use: Never used  ?Substance and Sexual Activity  ? Alcohol use: Yes  ?  Alcohol/week: 2.0 standard drinks  ?  Types: 1 Cans of beer, 1 Shots of liquor per week  ? Drug use: Never  ? Sexual activity: Yes  ?Other Topics Concern  ? Not on file  ?Social History Narrative  ? Not on file  ? ?Social Determinants of Health  ? ?Financial Resource Strain: Not on file  ?Food Insecurity: Not on file  ?Transportation Needs: Not on file  ?Physical Activity: Not on file  ?Stress: Not on file  ?Social Connections: Not on file  ?Intimate Partner Violence: Not on file  ? ? ?Current Outpatient Medications on File Prior to Visit  ?Medication Sig Dispense Refill  ?  amoxicillin (AMOXIL) 500 MG capsule TAKE 4 TABLETS 45 MINUTES PRIOR TO DENTAL WORK. 12 capsule 0  ? Cholecalciferol 50 MCG (2000 UT) CAPS Take 1 capsule by mouth daily.    ? levothyroxine (SYNTHROID) 175 MCG tablet Take 1 tablet (175 mcg total) by mouth daily. 90 tablet 3  ? Multiple Vitamin (MULTI VITAMIN MENS) tablet Take 1 tablet by mouth daily.    ? zolpidem (AMBIEN CR) 6.25 MG CR tablet TAKE 1 TABLET BY MOUTH AT BEDTIME AS NEEDED 90 tablet 1  ? ?No current facility-administered medications on file prior to visit.  ? ? ?No Known Allergies ? ?Family History  ?Problem Relation Age of Onset  ? Healthy Mother   ? Skin cancer Mother   ? Skin cancer Father   ? Healthy Father   ? Thyroid disease Neg Hx   ? Colon cancer Neg Hx   ? Esophageal cancer Neg Hx   ? Stomach cancer Neg Hx   ? Pancreatic cancer Neg Hx   ? Diabetes Neg Hx   ? ? ?BP 116/82 (BP Location: Left Arm, Patient Position: Sitting, Cuff Size: Normal)   Pulse 89  Ht 6' (1.829 m)   Wt 197 lb 6.4 oz (89.5 kg)   SpO2 96%   BMI 26.77 kg/m?  ? ? ?Review of Systems ? ?   ?Objective:  ? Physical Exam ?VITAL SIGNS:  See vs page ?GENERAL: no distress ?Neck: a healed scar is present.  I do not appreciate a nodule in the thyroid or elsewhere in the neck.   ? ? ?TG=0.2 ?Lab Results  ?Component Value Date  ? TSH 0.14 (L) 04/19/2022  ? ?   ?Assessment & Plan:  ?PTC: we discussed f/u, and decided on PET imaging ?Hypothyroidism: well-controlled for this setting.  Please continue the same synthroid ? ?

## 2022-04-19 NOTE — Patient Instructions (Addendum)
Blood tests are requested for you today.  We'll let you know about the results.   ?Let's recheck the pet CT.  you will receive a phone call, about a day and time for an appointment.   ?You should have an endocrinology follow-up appointment in 4 months.   ?

## 2022-04-20 ENCOUNTER — Ambulatory Visit (AMBULATORY_SURGERY_CENTER): Admitting: Gastroenterology

## 2022-04-20 ENCOUNTER — Encounter: Payer: Self-pay | Admitting: Gastroenterology

## 2022-04-20 VITALS — BP 90/54 | HR 67 | Temp 98.2°F | Resp 11 | Ht 72.0 in | Wt 202.0 lb

## 2022-04-20 DIAGNOSIS — K2970 Gastritis, unspecified, without bleeding: Secondary | ICD-10-CM

## 2022-04-20 DIAGNOSIS — K319 Disease of stomach and duodenum, unspecified: Secondary | ICD-10-CM | POA: Diagnosis not present

## 2022-04-20 DIAGNOSIS — R1310 Dysphagia, unspecified: Secondary | ICD-10-CM

## 2022-04-20 DIAGNOSIS — D120 Benign neoplasm of cecum: Secondary | ICD-10-CM

## 2022-04-20 DIAGNOSIS — R12 Heartburn: Secondary | ICD-10-CM | POA: Diagnosis not present

## 2022-04-20 DIAGNOSIS — K2289 Other specified disease of esophagus: Secondary | ICD-10-CM | POA: Diagnosis not present

## 2022-04-20 DIAGNOSIS — D123 Benign neoplasm of transverse colon: Secondary | ICD-10-CM | POA: Diagnosis not present

## 2022-04-20 DIAGNOSIS — K219 Gastro-esophageal reflux disease without esophagitis: Secondary | ICD-10-CM

## 2022-04-20 DIAGNOSIS — R7989 Other specified abnormal findings of blood chemistry: Secondary | ICD-10-CM

## 2022-04-20 DIAGNOSIS — K5730 Diverticulosis of large intestine without perforation or abscess without bleeding: Secondary | ICD-10-CM

## 2022-04-20 DIAGNOSIS — Z1211 Encounter for screening for malignant neoplasm of colon: Secondary | ICD-10-CM | POA: Diagnosis not present

## 2022-04-20 DIAGNOSIS — D125 Benign neoplasm of sigmoid colon: Secondary | ICD-10-CM | POA: Diagnosis not present

## 2022-04-20 DIAGNOSIS — K640 First degree hemorrhoids: Secondary | ICD-10-CM

## 2022-04-20 LAB — BASIC METABOLIC PANEL WITH GFR
BUN: 16 mg/dL (ref 6–23)
CO2: 29 meq/L (ref 19–32)
Calcium: 10.1 mg/dL (ref 8.4–10.5)
Chloride: 101 meq/L (ref 96–112)
Creatinine, Ser: 1.07 mg/dL (ref 0.40–1.50)
GFR: 78.67 mL/min
Glucose, Bld: 78 mg/dL (ref 70–99)
Potassium: 4.6 meq/L (ref 3.5–5.1)
Sodium: 139 meq/L (ref 135–145)

## 2022-04-20 LAB — HM COLONOSCOPY

## 2022-04-20 LAB — CEA: CEA: <2 ng/mL

## 2022-04-20 LAB — TSH: TSH: 0.14 u[IU]/mL — ABNORMAL LOW (ref 0.35–5.50)

## 2022-04-20 LAB — THYROGLOBULIN ANTIBODY: Thyroglobulin Ab: <1 [IU]/mL

## 2022-04-20 LAB — T4, FREE: Free T4: 1.09 ng/dL (ref 0.60–1.60)

## 2022-04-20 LAB — THYROGLOBULIN LEVEL: Thyroglobulin: 0.2 ng/mL — ABNORMAL LOW

## 2022-04-20 MED ORDER — SODIUM CHLORIDE 0.9 % IV SOLN: 500.0000 mL | Freq: Once | INTRAVENOUS | Status: DC

## 2022-04-20 NOTE — Progress Notes (Signed)
? ?GASTROENTEROLOGY PROCEDURE H&P NOTE  ? ?Primary Care Physician: ?Antonio Maw, MD ? ? ? ?Reason for Procedure:   Dysphagia, GERD, Colon cancer screening ? ?Plan:    EGD, Colonoscopy ? ?Patient is appropriate for endoscopic procedure(s) in the ambulatory ([place]) setting. ? ?The nature of the procedure, as well as the risks, benefits, and alternatives were carefully and thoroughly reviewed with the patient. Ample time for discussion and questions allowed. The patient understood, was satisfied, and agreed to proceed.  ? ? ? ?HPI: ?Antonio Graham is a 56 y.o. male who presents for EGD for evaluation of GERD and dysphagia along with colonoscopy for CRC screening.  ? ? ?Past Medical History:  ?Diagnosis Date  ? Congenital heart disease   ? Diverticulosis of colon   ? Thyroid disease   ? ? ?Past Surgical History:  ?Procedure Laterality Date  ? EYE SURGERY    ? HERNIA REPAIR    ? INGUINAL HERNIA REPAIR    ? thumb surgery Right   ? THYROIDECTOMY    ? TONSILLECTOMY    ? ? ?Prior to Admission medications   ?Medication Sig Start Date End Date Taking? Authorizing Provider  ?Cholecalciferol 50 MCG (2000 UT) CAPS Take 1 capsule by mouth daily.   Yes [provider]  ?levothyroxine (SYNTHROID) 175 MCG tablet Take 1 tablet (175 mcg total) by mouth daily. 01/19/22  Yes Antonio Shin, MD  ?zolpidem (AMBIEN CR) 6.25 MG CR tablet TAKE 1 TABLET BY MOUTH AT BEDTIME AS NEEDED 01/23/22  Yes Antonio Maw, MD  ?amoxicillin (AMOXIL) 500 MG capsule TAKE 4 TABLETS 45 MINUTES PRIOR TO DENTAL WORK. 04/05/21   Antonio Maw, MD  ?Multiple Vitamin (MULTI VITAMIN MENS) tablet Take 1 tablet by mouth daily.    [provider]  ? ? ?Current Outpatient Medications  ?Medication Sig Dispense Refill  ? Cholecalciferol 50 MCG (2000 UT) CAPS Take 1 capsule by mouth daily.    ? levothyroxine (SYNTHROID) 175 MCG tablet Take 1 tablet (175 mcg total) by mouth daily. 90 tablet 3  ? zolpidem (AMBIEN CR) 6.25 MG CR  tablet TAKE 1 TABLET BY MOUTH AT BEDTIME AS NEEDED 90 tablet 1  ? amoxicillin (AMOXIL) 500 MG capsule TAKE 4 TABLETS 45 MINUTES PRIOR TO DENTAL WORK. 12 capsule 0  ? Multiple Vitamin (MULTI VITAMIN MENS) tablet Take 1 tablet by mouth daily.    ? ?Current Facility-Administered Medications  ?Medication Dose Route Frequency Provider Last Rate Last Admin  ? 0.9 %  sodium chloride infusion  500 mL Intravenous Once Antonio Graham V, DO      ? ? ?Allergies as of 04/20/2022  ? (No Known Allergies)  ? ? ?Family History  ?Problem Relation Age of Onset  ? Healthy Mother   ? Skin cancer Mother   ? Skin cancer Father   ? Healthy Father   ? Thyroid disease Neg Hx   ? Colon cancer Neg Hx   ? Esophageal cancer Neg Hx   ? Stomach cancer Neg Hx   ? Pancreatic cancer Neg Hx   ? Diabetes Neg Hx   ? ? ?Social History  ? ?Socioeconomic History  ? Marital status: Married  ?  Spouse name: Not on file  ? Number of children: Not on file  ? Years of education: Not on file  ? Highest education level: Not on file  ?Occupational History  ? Not on file  ?Tobacco Use  ? Smoking status: Never  ? Smokeless tobacco: Never  ?Vaping  Use  ? Vaping Use: Never used  ?Substance and Sexual Activity  ? Alcohol use: Yes  ?  Alcohol/week: 2.0 standard drinks  ?  Types: 1 Cans of beer, 1 Shots of liquor per week  ? Drug use: Never  ? Sexual activity: Yes  ?Other Topics Concern  ? Not on file  ?Social History Narrative  ? Not on file  ? ?Social Determinants of Health  ? ?Financial Resource Strain: Not on file  ?Food Insecurity: Not on file  ?Transportation Needs: Not on file  ?Physical Activity: Not on file  ?Stress: Not on file  ?Social Connections: Not on file  ?Intimate Partner Violence: Not on file  ? ? ?Physical Exam: ?Vital signs in last 24 hours: ?'@BP'$  131/74   Pulse 75   Temp 98.2 ?F (36.8 ?C)   Ht 6' (1.829 m)   Wt 202 lb (91.6 kg)   SpO2 97%   BMI 27.40 kg/m?  ?GEN: NAD ?EYE: Sclerae anicteric ?ENT: MMM ?CV: Non-tachycardic ?Pulm: CTA b/l ?GI:  Soft, NT/ND ?NEURO:  Alert & Oriented x 3 ? ? ?[name], DO ?[facility] ? ? ?04/20/2022 2:29 PM ? ?

## 2022-04-20 NOTE — Patient Instructions (Addendum)
Information on gastritis, polyps, diverticulosis and hemorrhoids given to you today. ? ?Await pathology results. ? ?Resume previous diet and medications. ? ?Use fiber supplement like Citrucel, FiberCon, Konsyl or Metamucil. ? ?YOU HAD AN ENDOSCOPIC PROCEDURE TODAY AT [facility]:   Refer to the procedure report that was given to you for any specific questions about what was found during the examination.  If the procedure report does not answer your questions, please call your gastroenterologist to clarify.  If you requested that your care partner not be given the details of your procedure findings, then the procedure report has been included in a sealed envelope for you to review at your convenience later. ? ?YOU SHOULD EXPECT: Some feelings of bloating in the abdomen. Passage of more gas than usual.  Walking can help get rid of the air that was put into your GI tract during the procedure and reduce the bloating. If you had a lower endoscopy (such as a colonoscopy or flexible sigmoidoscopy) you may notice spotting of blood in your stool or on the toilet paper. If you underwent a bowel prep for your procedure, you may not have a normal bowel movement for a few days. ? ?Please Note:  You might notice some irritation and congestion in your nose or some drainage.  This is from the oxygen used during your procedure.  There is no need for concern and it should clear up in a day or so. ? ?SYMPTOMS TO REPORT IMMEDIATELY: ? ?Following lower endoscopy (colonoscopy or flexible sigmoidoscopy): ? Excessive amounts of blood in the stool ? Significant tenderness or worsening of abdominal pains ? Swelling of the abdomen that is new, acute ? Fever of 100?F or higher ? ?Following upper endoscopy (EGD) ? Vomiting of blood or coffee ground material ? New chest pain or pain under the shoulder blades ? Painful or persistently difficult swallowing ? New shortness of breath ? Fever of 100?F or higher ? Black, tarry-looking  stools ? ?For urgent or emergent issues, a gastroenterologist can be reached at any hour by calling [phone]. ?Do not use MyChart messaging for urgent concerns.  ? ? ?DIET:  We do recommend a small meal at first, but then you may proceed to your regular diet.  Drink plenty of fluids but you should avoid alcoholic beverages for 24 hours. ? ?ACTIVITY:  You should plan to take it easy for the rest of today and you should NOT DRIVE or use heavy machinery until tomorrow (because of the sedation medicines used during the test).   ? ?FOLLOW UP: ?Our staff will call the number listed on your records 48-72 hours following your procedure to check on you and address any questions or concerns that you may have regarding the information given to you following your procedure. If we do not reach you, we will leave a message.  We will attempt to reach you two times.  During this call, we will ask if you have developed any symptoms of COVID 19. If you develop any symptoms (ie: fever, flu-like symptoms, shortness of breath, cough etc.) before then, please call [phone].  If you test positive for Covid 19 in the 2 weeks post procedure, please call and report this information to [place].   ? ?If any biopsies were taken you will be contacted by phone or by letter within the next 1-3 weeks.  Please call us at [phone] if you have not heard about the biopsies in 3 weeks.  ? ? ?SIGNATURES/CONFIDENTIALITY: ?  You and/or your care partner have signed paperwork which will be entered into your electronic medical record.  These signatures attest to the fact that that the information above on your After Visit Summary has been reviewed and is understood.  Full responsibility of the confidentiality of this discharge information lies with you and/or your care-partner.  ?

## 2022-04-20 NOTE — Progress Notes (Signed)
Report to PACU, RN, vss, BBS= Clear.  

## 2022-04-20 NOTE — Progress Notes (Signed)
Called to room to assist during endoscopic procedure.  Patient ID and intended procedure confirmed with present staff. Received instructions for my participation in the procedure from the performing physician.  

## 2022-04-20 NOTE — Op Note (Signed)
[place] ?Patient Name: Antonio Graham ?Procedure Date: 04/20/2022 2:33 PM ?MRN: [id number] ?Endoscopist: [name] , MD ?Age: 56 ?Referring MD:  ?Date of Birth: Nov 17, 1966 ?Gender: Male ?Account #: [id number] ?Procedure:                Colonoscopy ?Indications:              Screening for colorectal malignant neoplasm, This  ?                          is the patient's first colonoscopy ?Medicines:                Monitored Anesthesia Care ?Procedure:                Pre-Anesthesia Assessment: ?                          - Prior to the procedure, a History and Physical  ?                          was performed, and patient medications and  ?                          allergies were reviewed. The patient's tolerance of  ?                          previous anesthesia was also reviewed. The risks  ?                          and benefits of the procedure and the sedation  ?                          options and risks were discussed with the patient.  ?                          All questions were answered, and informed consent  ?                          was obtained. Prior Anticoagulants: The patient has  ?                          taken no previous anticoagulant or antiplatelet  ?                          agents. ASA Grade Assessment: II - A patient with  ?                          mild systemic disease. After reviewing the risks  ?                          and benefits, the patient was deemed in  ?                          satisfactory condition to undergo the procedure. ?  After obtaining informed consent, the colonoscope  ?                          was passed under direct vision. Throughout the  ?                          procedure, the patient's blood pressure, pulse, and  ?                          oxygen saturations were monitored continuously. The  ?                          Olympus CF-HQ190L ([id number]) Colonoscope was  ?                          introduced through the anus  and advanced to the the  ?                          terminal ileum. The colonoscopy was performed  ?                          without difficulty. The patient tolerated the  ?                          procedure well. The quality of the bowel  ?                          preparation was good. The terminal ileum, ileocecal  ?                          valve, appendiceal orifice, and rectum were  ?                          photographed. ?Scope In: 2:53:54 PM ?Scope Out: 3:10:06 PM ?Scope Withdrawal Time: 0 hours 12 minutes 29 seconds  ?Total Procedure Duration: 0 hours 16 minutes 12 seconds  ?Findings:                 The perianal and digital rectal examinations were  ?                          normal. ?                          A 8 mm polyp was found in the sigmoid colon. The  ?                          polyp was sessile. The polyp was removed with a  ?                          cold snare. Resection and retrieval were complete.  ?                          Estimated blood loss was minimal. ?  A 10 mm polyp was found in the cecum. The polyp was  ?                          flat with adherent mucus cap. The polyp was removed  ?                          with a cold snare. Resection and retrieval were  ?                          complete. Estimated blood loss was minimal. ?                          A 2 mm polyp was found in the transverse colon. The  ?                          polyp was sessile. The polyp was removed with a  ?                          cold snare. Resection and retrieval were complete.  ?                          Estimated blood loss was minimal. ?                          Multiple small and large-mouthed diverticula were  ?                          found in the sigmoid colon, descending colon,  ?                          transverse colon and ascending colon. ?                          Non-bleeding internal hemorrhoids were found during  ?                          retroflexion. The  hemorrhoids were small. ?                          The terminal ileum appeared normal. ?Complications:            No immediate complications. ?Estimated Blood Loss:     Estimated blood loss was minimal. ?Impression:               - One 8 mm polyp in the sigmoid colon, removed with  ?                          a cold snare. Resected and retrieved. ?                          - One 10 mm polyp in the cecum, removed with a cold  ?  snare. Resected and retrieved. ?                          - One 2 mm polyp in the transverse colon, removed  ?                          with a cold snare. Resected and retrieved. ?                          - Diverticulosis in the sigmoid colon, in the  ?                          descending colon, in the transverse colon and in  ?                          the ascending colon. ?                          - Non-bleeding internal hemorrhoids. ?                          - The examined portion of the ileum was normal. ?Recommendation:           - Patient has a contact number available for  ?                          emergencies. The signs and symptoms of potential  ?                          delayed complications were discussed with the  ?                          patient. Return to normal activities tomorrow.  ?                          Written discharge instructions were provided to the  ?                          patient. ?                          - Resume previous diet. ?                          - Continue present medications. ?                          - Await pathology results. ?                          - Repeat colonoscopy for surveillance based on  ?                          pathology results. ?                          - Use fiber,  for example Citrucel, Fibercon, Konsyl  ?                          or Metamucil. ?                          - Return to GI clinic PRN. ?[name], MD ?04/20/2022 3:21:39 PM ?

## 2022-04-20 NOTE — Op Note (Signed)
[place] ?Patient Name: Antonio Graham ?Procedure Date: 04/20/2022 2:34 PM ?MRN: [id number] ?Endoscopist: [name] , MD ?Age: 56 ?Referring MD:  ?Date of Birth: 1966/12/03 ?Gender: Male ?Account #: [id number] ?Procedure:                Upper GI endoscopy ?Indications:              Dysphagia, Heartburn, Suspected esophageal reflux ?Medicines:                Monitored Anesthesia Care ?Procedure:                Pre-Anesthesia Assessment: ?                          - Prior to the procedure, a History and Physical  ?                          was performed, and patient medications and  ?                          allergies were reviewed. The patient's tolerance of  ?                          previous anesthesia was also reviewed. The risks  ?                          and benefits of the procedure and the sedation  ?                          options and risks were discussed with the patient.  ?                          All questions were answered, and informed consent  ?                          was obtained. Prior Anticoagulants: The patient has  ?                          taken no previous anticoagulant or antiplatelet  ?                          agents. ASA Grade Assessment: II - A patient with  ?                          mild systemic disease. After reviewing the risks  ?                          and benefits, the patient was deemed in  ?                          satisfactory condition to undergo the procedure. ?                          After obtaining informed consent, the endoscope was  ?  passed under direct vision. Throughout the  ?                          procedure, the patient's blood pressure, pulse, and  ?                          oxygen saturations were monitored continuously. The  ?                          GIF HQ190 #[id number] was introduced through the  ?                          mouth, and advanced to the second part of duodenum.  ?                          The  upper GI endoscopy was accomplished without  ?                          difficulty. The patient tolerated the procedure  ?                          well. ?Scope In: ?Scope Out: ?Findings:                 The examined esophagus was normal. Given the  ?                          symptoms of proximal dysphagia, the decision was  ?                          made to perform esophageal dilation. A guidewire  ?                          was placed and the scope was withdrawn. Dilation  ?                          was performed with a Savary dilator with esophageal  ?                          dilation. A guidewire was placed and the scope was  ?                          withdrawn. Dilation was performed with a Savary  ?                          dilator with no resistance at 17 mm. The dilation  ?                          site was examined following endoscope reinsertion  ?                          and showed no bleeding, mucosal tear or  ?  perforation. Biopsies were then obtained from the  ?                          proximal and distal esophagus with cold forceps for  ?                          histology to rule out subtle eosinophilic  ?                          esophagitis. Estimated blood loss was minimal. ?                          The Z-line was regular and was found 40 cm from the  ?                          incisors. ?                          The gastroesophageal flap valve was visualized  ?                          endoscopically and classified as Hill Grade II  ?                          (fold present, opens with respiration). ?                          Scattered mild inflammation characterized by  ?                          erythema was found in the gastric body and in the  ?                          gastric antrum. Biopsies were taken with a cold  ?                          forceps for Helicobacter pylori testing. Estimated  ?                          blood loss was minimal. ?                           The examined duodenum was normal. ?Complications:            No immediate complications. ?Estimated Blood Loss:     Estimated blood loss was minimal. ?Impression:               - Normal esophagus. Dilated with a 17 mm Savary  ?                          dilator then biopsied. ?                          - Z-line regular, 40 cm from the incisors. ?                          -  Gastroesophageal flap valve classified as Hill  ?                          Grade II (fold present, opens with respiration). ?                          - Gastritis. Biopsied. ?                          - Normal examined duodenum. ?Recommendation:           - Patient has a contact number available for  ?                          emergencies. The signs and symptoms of potential  ?                          delayed complications were discussed with the  ?                          patient. Return to normal activities tomorrow.  ?                          Written discharge instructions were provided to the  ?                          patient. ?                          - Resume previous diet. ?                          - Continue present medications. ?                          - Await pathology results. ?                          - Repeat upper endoscopy PRN. ?[name], MD ?04/20/2022 3:17:11 PM ?

## 2022-04-20 NOTE — Progress Notes (Signed)
VS-CW  Pt's states no medical or surgical changes since previsit or office visit.  

## 2022-04-21 ENCOUNTER — Other Ambulatory Visit: Payer: Self-pay | Admitting: Pediatrics

## 2022-04-23 ENCOUNTER — Other Ambulatory Visit: Payer: Self-pay | Admitting: Family Medicine

## 2022-04-23 DIAGNOSIS — Z792 Long term (current) use of antibiotics: Secondary | ICD-10-CM

## 2022-04-24 ENCOUNTER — Other Ambulatory Visit: Payer: Self-pay | Admitting: Family Medicine

## 2022-04-24 ENCOUNTER — Telehealth: Payer: Self-pay | Admitting: *Deleted

## 2022-04-24 DIAGNOSIS — Z792 Long term (current) use of antibiotics: Secondary | ICD-10-CM

## 2022-04-24 NOTE — Telephone Encounter (Signed)
First attempt for follow up phone call. No answer at number given.  Left message on voicemail.   ?

## 2022-04-24 NOTE — Telephone Encounter (Signed)
?  Follow up Call- ? ? ?  04/20/2022  ?  1:51 PM  ?Call back number  ?Post procedure Call Back phone  # [phone]  ?Permission to leave phone message Yes  ?  ?No answer at 2nd attempt follow up phone call.  Left message on voicemail.   ?

## 2022-04-24 NOTE — Telephone Encounter (Signed)
Pt needs appt

## 2022-04-26 ENCOUNTER — Encounter: Payer: Self-pay | Admitting: Gastroenterology

## 2022-05-05 ENCOUNTER — Ambulatory Visit (HOSPITAL_COMMUNITY)
Admission: RE | Admit: 2022-05-05 | Discharge: 2022-05-05 | Disposition: A | Discharge status: Home / Self Care | Source: Ambulatory Visit | Attending: Endocrinology | Admitting: Endocrinology

## 2022-05-05 DIAGNOSIS — C73 Malignant neoplasm of thyroid gland: Secondary | ICD-10-CM | POA: Diagnosis present

## 2022-05-05 LAB — GLUCOSE, CAPILLARY: Glucose-Capillary: 99 mg/dL (ref 70–99)

## 2022-05-05 MED ORDER — FLUDEOXYGLUCOSE F - 18 (FDG) INJECTION
9.7700 | Freq: Once | INTRAVENOUS | Status: AC
  Administered 2022-05-05: 9.77 via INTRAVENOUS

## 2022-05-08 ENCOUNTER — Telehealth: Payer: Self-pay

## 2022-05-08 NOTE — Telephone Encounter (Signed)
Vm left for patient to callback for imaging results

## 2022-05-08 NOTE — Telephone Encounter (Signed)
No signs tracer avid recurrent tumor or metastatic disease. 2. Incidental note of sigmoid diverticulosis with signs of uncomplicated acute diverticulitis within the left lower quadrant of the abdomen. No signs of free fluid or focal fluid collections. 3. Small hiatal hernia. 4. These results will be called to the ordering clinician or representative by the Radiologist Assistant, and communication documented in the PACS or [employer]

## 2022-05-08 NOTE — Telephone Encounter (Signed)
Left another vm 

## 2022-05-09 ENCOUNTER — Other Ambulatory Visit: Payer: Self-pay | Admitting: Family Medicine

## 2022-05-09 DIAGNOSIS — Z792 Long term (current) use of antibiotics: Secondary | ICD-10-CM

## 2022-05-09 NOTE — Telephone Encounter (Signed)
Patient returned the call and left a voicemail. I attempted to return the patient's call and received no answer. LVM for a call back

## 2022-05-10 NOTE — Telephone Encounter (Signed)
Another vm left and mychart message sent as well.

## 2022-05-16 NOTE — Telephone Encounter (Signed)
Patient reviewed result on mychart

## 2022-07-29 ENCOUNTER — Encounter: Payer: Self-pay | Admitting: Family Medicine

## 2022-07-31 ENCOUNTER — Other Ambulatory Visit: Payer: Self-pay

## 2022-07-31 ENCOUNTER — Other Ambulatory Visit: Payer: Self-pay | Admitting: Family Medicine

## 2022-07-31 DIAGNOSIS — F5101 Primary insomnia: Secondary | ICD-10-CM

## 2022-07-31 MED ORDER — ZOLPIDEM TARTRATE ER 6.25 MG PO TBCR: 6.2500 mg | EXTENDED_RELEASE_TABLET | Freq: Every evening | ORAL | 1 refills | Status: DC | PRN

## 2022-07-31 NOTE — Telephone Encounter (Signed)
Refill request for pending Rx last OV 01/01/22 last RF 02/08/22. Please advice.

## 2022-08-29 ENCOUNTER — Ambulatory Visit: Admitting: Internal Medicine

## 2022-10-10 ENCOUNTER — Encounter: Payer: Self-pay | Admitting: Family Medicine

## 2023-01-28 ENCOUNTER — Encounter: Payer: Self-pay | Admitting: Family Medicine

## 2023-01-29 ENCOUNTER — Other Ambulatory Visit: Payer: Self-pay

## 2023-01-29 DIAGNOSIS — F5101 Primary insomnia: Secondary | ICD-10-CM

## 2023-01-29 MED ORDER — ZOLPIDEM TARTRATE ER 6.25 MG PO TBCR: 6.2500 mg | EXTENDED_RELEASE_TABLET | Freq: Every evening | ORAL | 1 refills | Status: DC | PRN

## 2023-01-29 NOTE — Telephone Encounter (Signed)
Refill request for pending Rx last OV 01/05/22 last  refill 07/31/22, patient has upcoming appointment scheduled for 02/22/23. Please advise.

## 2023-02-22 ENCOUNTER — Encounter: Payer: Self-pay | Admitting: Family Medicine

## 2023-02-22 ENCOUNTER — Ambulatory Visit (INDEPENDENT_AMBULATORY_CARE_PROVIDER_SITE_OTHER): Admitting: Family Medicine

## 2023-02-22 VITALS — BP 118/78 | HR 65 | Temp 98.2°F | Ht 72.0 in | Wt 205.6 lb

## 2023-02-22 DIAGNOSIS — R6882 Decreased libido: Secondary | ICD-10-CM | POA: Insufficient documentation

## 2023-02-22 DIAGNOSIS — R7989 Other specified abnormal findings of blood chemistry: Secondary | ICD-10-CM | POA: Diagnosis not present

## 2023-02-22 DIAGNOSIS — Z0000 Encounter for general adult medical examination without abnormal findings: Secondary | ICD-10-CM

## 2023-02-22 DIAGNOSIS — Z23 Encounter for immunization: Secondary | ICD-10-CM | POA: Diagnosis not present

## 2023-02-22 DIAGNOSIS — Z0001 Encounter for general adult medical examination with abnormal findings: Secondary | ICD-10-CM | POA: Diagnosis not present

## 2023-02-22 LAB — CBC
HCT: 44.6 % (ref 39.0–52.0)
Hemoglobin: 15.4 g/dL (ref 13.0–17.0)
MCHC: 34.6 g/dL (ref 30.0–36.0)
MCV: 86.9 fl (ref 78.0–100.0)
Platelets: 207 10*3/uL (ref 150.0–400.0)
RBC: 5.14 Mil/uL (ref 4.22–5.81)
RDW: 14.3 % (ref 11.5–15.5)
WBC: 5.1 10*3/uL (ref 4.0–10.5)

## 2023-02-22 LAB — BASIC METABOLIC PANEL
BUN: 23 mg/dL (ref 6–23)
CO2: 25 mEq/L (ref 19–32)
Calcium: 9.9 mg/dL (ref 8.4–10.5)
Chloride: 104 mEq/L (ref 96–112)
Creatinine, Ser: 0.97 mg/dL (ref 0.40–1.50)
GFR: 87.97 mL/min (ref >60.00)
Glucose, Bld: 90 mg/dL (ref 70–99)
Potassium: 4.1 mEq/L (ref 3.5–5.1)
Sodium: 137 mEq/L (ref 135–145)

## 2023-02-22 LAB — HEPATIC FUNCTION PANEL
ALT: 20 U/L (ref 0–53)
AST: 26 U/L (ref 0–37)
Albumin: 4.5 g/dL (ref 3.5–5.2)
Alkaline Phosphatase: 51 U/L (ref 39–117)
Bilirubin, Direct: 0.1 mg/dL (ref 0.0–0.3)
Total Bilirubin: 0.6 mg/dL (ref 0.2–1.2)
Total Protein: 7.3 g/dL (ref 6.0–8.3)

## 2023-02-22 LAB — LIPID PANEL
Cholesterol: 233 mg/dL — ABNORMAL HIGH (ref 0–200)
HDL: 48.6 mg/dL (ref >39.00)
LDL Cholesterol: 154 mg/dL — ABNORMAL HIGH (ref 0–99)
NonHDL: 184.22
Total CHOL/HDL Ratio: 5
Triglycerides: 149 mg/dL (ref 0.0–149.0)
VLDL: 29.8 mg/dL (ref 0.0–40.0)

## 2023-02-22 LAB — PSA: PSA: 0.39 ng/mL (ref 0.10–4.00)

## 2023-02-22 NOTE — Progress Notes (Signed)
Established Patient Office Visit   Subjective:  Patient ID: Antonio Graham, male    DOB: Feb 20, 1966  Age: 57 y.o. MRN: [id number]  No chief complaint on file.   HPI Encounter Diagnoses  Name Primary?   Need for shingles vaccine Yes   Healthcare maintenance    Libido, decreased    Elevated LFTs    Doing well.  Exercising regularly most days of the week.  Regular dental care.  Regular follow-up with endocrinology for history of thyroid cancer and cardiovascular surgeon for history of bicuspid aortic valve with ascending aortic aneurysm.  No evidence for thyroid cancer.  Aortic aneurysm is stable.  He is concerned about decreased muscle mass.  Libido is somewhat diminished.   Review of Systems  Constitutional: Negative.   HENT: Negative.    Eyes:  Negative for blurred vision, discharge and redness.  Respiratory: Negative.    Cardiovascular: Negative.   Gastrointestinal:  Negative for abdominal pain.  Genitourinary: Negative.   Musculoskeletal: Negative.  Negative for myalgias.  Skin:  Negative for rash.  Neurological:  Negative for tingling, loss of consciousness and weakness.  Endo/Heme/Allergies:  Negative for polydipsia.     Current Outpatient Medications:    Cholecalciferol 50 MCG (2000 UT) CAPS, Take 1 capsule by mouth daily., Disp: , Rfl:    levothyroxine (SYNTHROID) 175 MCG tablet, Take 1 tablet (175 mcg total) by mouth daily. (Patient taking differently: Take 188 mcg by mouth daily.), Disp: 90 tablet, Rfl: 3   Multiple Vitamin (MULTI VITAMIN MENS) tablet, Take 1 tablet by mouth daily., Disp: , Rfl:    zolpidem (AMBIEN CR) 6.25 MG CR tablet, Take 1 tablet (6.25 mg total) by mouth at bedtime as needed., Disp: 90 tablet, Rfl: 1   amoxicillin (AMOXIL) 500 MG capsule, TAKE 4 TABLETS 45 MINUTES PRIOR TO DENTAL WORK. (Patient not taking: Reported on 02/22/2023), Disp: 12 capsule, Rfl: 0   Objective:     BP 118/78 (BP Location: Right Arm, Patient Position: Sitting)   Pulse  65   Temp 98.2 F (36.8 C) (Temporal)   Ht 6' (1.829 m)   Wt 205 lb 9.6 oz (93.3 kg)   SpO2 95%   BMI 27.88 kg/m  Wt Readings from Last 3 Encounters:  02/22/23 205 lb 9.6 oz (93.3 kg)  04/20/22 202 lb (91.6 kg)  04/19/22 197 lb 6.4 oz (89.5 kg)      Physical Exam Constitutional:      General: He is not in acute distress.    Appearance: Normal appearance. He is not ill-appearing, toxic-appearing or diaphoretic.  HENT:     Head: Normocephalic and atraumatic.     Right Ear: External ear normal.     Left Ear: External ear normal.     Mouth/Throat:     Mouth: Mucous membranes are moist.     Pharynx: Oropharynx is clear. No oropharyngeal exudate or posterior oropharyngeal erythema.  Eyes:     General: No scleral icterus.       Right eye: No discharge.        Left eye: No discharge.     Extraocular Movements: Extraocular movements intact.     Conjunctiva/sclera: Conjunctivae normal.     Pupils: Pupils are equal, round, and reactive to light.  Cardiovascular:     Rate and Rhythm: Normal rate and regular rhythm.  Pulmonary:     Effort: Pulmonary effort is normal. No respiratory distress.     Breath sounds: Normal breath sounds.  Abdominal:  General: Bowel sounds are normal. There is no distension.     Tenderness: There is no abdominal tenderness. There is no guarding.     Hernia: No hernia is present.  Musculoskeletal:     Cervical back: No rigidity or tenderness.  Lymphadenopathy:     Cervical: No cervical adenopathy.  Skin:    General: Skin is warm and dry.  Neurological:     Mental Status: He is alert and oriented to person, place, and time.  Psychiatric:        Mood and Affect: Mood normal.        Behavior: Behavior normal.      No results found for any visits on 02/22/23.    The 10-year ASCVD risk score (Arnett DK, et al., 2019) is: 5.9%    Assessment & Plan:   Need for shingles vaccine -     Varicella-zoster vaccine IM  Healthcare maintenance -      CBC -     Lipid panel -     PSA -     Urinalysis, Routine w reflex microscopic -     Basic metabolic panel  Libido, decreased -     Testosterone Total,Free,Bio, Males  Elevated LFTs -     Hepatic function panel    Return in about 1 year (around 02/22/2024), or may need [place] of liver pending results of liver function testing..  Encouraged him to continue his healthy active lifestyle.  Information was given on health maintenance and disease prevention.  Yearly follow-up with endocrinology and cardiovascular surgery with his history of bicuspid aortic valve and ascending aortic aneurysm.  Libby Maw, MD

## 2023-02-23 LAB — TESTOSTERONE TOTAL,FREE,BIO, MALES
Albumin: 4.9 g/dL (ref 3.6–5.1)
Sex Hormone Binding: 26 nmol/L (ref 10–50)
Testosterone, Bioavailable: 165.3 ng/dL (ref 110.0–575.0)
Testosterone, Free: 74.1 pg/mL (ref 46.0–224.0)
Testosterone: 472 ng/dL (ref 250–827)

## 2023-02-23 LAB — URINALYSIS, ROUTINE W REFLEX MICROSCOPIC
Bilirubin Urine: NEGATIVE
Hgb urine dipstick: NEGATIVE
Ketones, ur: NEGATIVE
Leukocytes,Ua: NEGATIVE
Nitrite: NEGATIVE
Specific Gravity, Urine: 1.02 (ref 1.000–1.030)
Total Protein, Urine: NEGATIVE
Urine Glucose: NEGATIVE
Urobilinogen, UA: 0.2 (ref 0.0–1.0)
pH: 5.5 (ref 5.0–8.0)

## 2023-03-19 ENCOUNTER — Telehealth: Admitting: Physician Assistant

## 2023-03-19 DIAGNOSIS — M545 Low back pain, unspecified: Secondary | ICD-10-CM

## 2023-03-19 MED ORDER — METHYLPREDNISOLONE 4 MG PO TBPK: ORAL_TABLET | ORAL | 0 refills | Status: DC

## 2023-03-19 MED ORDER — CYCLOBENZAPRINE HCL 10 MG PO TABS: 5.0000 mg | ORAL_TABLET | Freq: Three times a day (TID) | ORAL | 0 refills | Status: DC | PRN

## 2023-03-19 NOTE — Progress Notes (Signed)
We are sorry that you are not feeling well.  Here is how we plan to help!  Based on what you have shared with me it looks like you mostly have acute back pain.  Acute back pain is defined as musculoskeletal pain that can resolve in 1-3 weeks with conservative treatment.  I have prescribed Medrol dose pak, take as directed on package instructions, as well as Flexeril 10 mg every eight hours as needed which is a muscle relaxer  Some patients experience stomach irritation or in increased heartburn with anti-inflammatory drugs.  Please keep in mind that muscle relaxer's can cause fatigue and should not be taken while at work or driving.  Back pain is very common.  The pain often gets better over time.  The cause of back pain is usually not dangerous.  Most people can learn to manage their back pain on their own.  Home Care Stay active.  Start with short walks on flat ground if you can.  Try to walk farther each day. Do not sit, drive or stand in one place for more than 30 minutes.  Do not stay in bed. Do not avoid exercise or work.  Activity can help your back heal faster. Be careful when you bend or lift an object.  Bend at your knees, keep the object close to you, and do not twist. Sleep on a firm mattress.  Lie on your side, and bend your knees.  If you lie on your back, put a pillow under your knees. Only take medicines as told by your doctor. Put ice on the injured area. Put ice in a plastic bag Place a towel between your skin and the bag Leave the ice on for 15-20 minutes, 3-4 times a day for the first 2-3 days. 210 After that, you can switch between ice and heat packs. Ask your doctor about back exercises or massage. Avoid feeling anxious or stressed.  Find good ways to deal with stress, such as exercise.  Get Help Right Way If: Your pain does not go away with rest or medicine. Your pain does not go away in 1 week. You have new problems. You do not feel well. The pain spreads into your  legs. You cannot control when you poop (bowel movement) or pee (urinate) You feel sick to your stomach (nauseous) or throw up (vomit) You have belly (abdominal) pain. You feel like you may pass out (faint). If you develop a fever.  Make Sure you: Understand these instructions. Will watch your condition Will get help right away if you are not doing well or get worse.  Your e-visit answers were reviewed by a board certified advanced clinical practitioner to complete your personal care plan.  Depending on the condition, your plan could have included both over the counter or prescription medications.  If there is a problem please reply  once you have received a response from your provider.  Your safety is important to [place].  If you have drug allergies check your prescription carefully.    You can use MyChart to ask questions about today's visit, request a non-urgent call back, or ask for a work or school excuse for 24 hours related to this e-Visit. If it has been greater than 24 hours you will need to follow up with your provider, or enter a new e-Visit to address those concerns.  You will get an e-mail in the next two days asking about your experience.  I hope that your e-visit has been  valuable and will speed your recovery. Thank you for using e-visits.  I have spent 5 minutes in review of e-visit questionnaire, review and updating patient chart, medical decision making and response to patient.   Mar Daring, PA-C

## 2023-04-23 ENCOUNTER — Other Ambulatory Visit: Payer: Self-pay

## 2023-04-23 ENCOUNTER — Encounter: Payer: Self-pay | Admitting: Family Medicine

## 2023-04-23 DIAGNOSIS — F5101 Primary insomnia: Secondary | ICD-10-CM

## 2023-04-23 MED ORDER — ZOLPIDEM TARTRATE ER 6.25 MG PO TBCR: 6.2500 mg | EXTENDED_RELEASE_TABLET | Freq: Every evening | ORAL | 0 refills | Status: DC | PRN

## 2023-04-23 NOTE — Telephone Encounter (Signed)
Please advise message below requesting refill on Ambien last RF 01/29/23 per patients pharmacy they are on back order patient called another pharmacy who states that they can fill 30 day supply only. Okay to fill?

## 2023-07-10 ENCOUNTER — Telehealth: Admitting: Nurse Practitioner

## 2023-07-10 ENCOUNTER — Other Ambulatory Visit: Payer: Self-pay | Admitting: Nurse Practitioner

## 2023-07-10 DIAGNOSIS — H00015 Hordeolum externum left lower eyelid: Secondary | ICD-10-CM

## 2023-07-10 MED ORDER — CEPHALEXIN 500 MG PO CAPS: 500.0000 mg | ORAL_CAPSULE | Freq: Four times a day (QID) | ORAL | 0 refills | Status: AC

## 2023-07-10 MED ORDER — NEOMYCIN-POLYMYXIN-HC 3.5-10000-1 OP SUSP: 3.0000 [drp] | Freq: Four times a day (QID) | OPHTHALMIC | 0 refills | Status: AC

## 2023-07-10 NOTE — Progress Notes (Signed)
  E-Visit for Stye   We are sorry that you are not feeling well. Here is how we plan to help!  Based on what you have shared with me it looks like you have a stye.  A stye is an inflammation of the eyelid.  It is often a red, painful lump near the edge of the eyelid that may look like a boil or a pimple.  A stye develops when an infection occurs at the base of an eyelash.   We have made appropriate suggestions for you based upon your presentation: Your symptoms may indicate an infection of the sclera.  The use of anti-inflammatory and antibiotic eye drops for a week will help resolve this condition.  I have sent in neomycin-polymyxin HC opthalmic suspension, two to three drops in the affected eye every 4 hours.  If your symptoms do not improve over the next two to three days you should be seen in your doctor's office.  If you symptoms worsen while you are traveling, or with selling under the eye in the orbital space, fever or worsening of stye you may need to start oral antibiotics.   I suggest starting with the eye drops and using warm compresses for the next 48 hours and if there is no improvement starting: oral Keflex four times daily for 5 days   Meds ordered this encounter  Medications   neomycin-polymyxin-hydrocortisone (CORTISPORIN) 3.5-10000-1 ophthalmic suspension    Sig: Place 3 drops into the left eye 4 (four) times daily for 5 days.    Dispense:  7.5 mL    Refill:  0   cephALEXin (KEFLEX) 500 MG capsule    Sig: Take 1 capsule (500 mg total) by mouth 4 (four) times daily for 5 days.    Dispense:  20 capsule    Refill:  0      HOME CARE:  Wash your hands often! Let the stye open on its own. Don't squeeze or open it. Don't rub your eyes. This can irritate your eyes and let in bacteria.  If you need to touch your eyes, wash your hands first. Don't wear eye makeup or contact lenses until the area has healed.  GET HELP RIGHT AWAY IF:  Your symptoms do not improve. You  develop blurred or loss of vision. Your symptoms worsen (increased discharge, pain or redness).   Thank you for choosing an e-visit.  Your e-visit answers were reviewed by a board certified advanced clinical practitioner to complete your personal care plan. Depending upon the condition, your plan could have included both over the counter or prescription medications.  Please review your pharmacy choice. Make sure the pharmacy is open so you can pick up prescription now. If there is a problem, you may contact your provider through [employer] and have the prescription routed to another pharmacy.  Your safety is important to [place]. If you have drug allergies check your prescription carefully.   For the next 24 hours you can use MyChart to ask questions about today's visit, request a non-urgent call back, or ask for a work or school excuse. You will get an email in the next two days asking about your experience. I hope that your e-visit has been valuable and will speed your recovery.   I spent approximately 5 minutes reviewing the patient's history, current symptoms and coordinating their care today.

## 2023-07-23 ENCOUNTER — Other Ambulatory Visit: Payer: Self-pay | Admitting: Family Medicine

## 2023-07-23 DIAGNOSIS — F5101 Primary insomnia: Secondary | ICD-10-CM

## 2023-07-24 ENCOUNTER — Other Ambulatory Visit: Payer: Self-pay

## 2023-07-24 DIAGNOSIS — F5101 Primary insomnia: Secondary | ICD-10-CM

## 2023-07-24 MED ORDER — ZOLPIDEM TARTRATE ER 6.25 MG PO TBCR: 6.2500 mg | EXTENDED_RELEASE_TABLET | Freq: Every evening | ORAL | 0 refills | Status: DC | PRN

## 2023-07-27 ENCOUNTER — Other Ambulatory Visit: Payer: Self-pay | Admitting: Family Medicine

## 2023-07-27 DIAGNOSIS — F5101 Primary insomnia: Secondary | ICD-10-CM

## 2023-07-30 MED ORDER — ZOLPIDEM TARTRATE ER 6.25 MG PO TBCR
6.2500 mg | EXTENDED_RELEASE_TABLET | Freq: Every evening | ORAL | 0 refills | Status: DC | PRN
Start: 2023-07-30 — End: 2023-10-29

## 2023-07-30 MED ORDER — ZOLPIDEM TARTRATE ER 6.25 MG PO TBCR
6.2500 mg | EXTENDED_RELEASE_TABLET | Freq: Every evening | ORAL | 0 refills | Status: DC | PRN
Start: 2023-07-30 — End: 2023-07-30

## 2023-07-30 NOTE — Addendum Note (Signed)
Addended by: Andrez Grime on: 07/30/2023 05:04 PM   Modules accepted: Orders

## 2023-10-29 ENCOUNTER — Encounter: Payer: Self-pay | Admitting: Family Medicine

## 2023-10-29 ENCOUNTER — Other Ambulatory Visit: Payer: Self-pay | Admitting: Family Medicine

## 2023-10-29 DIAGNOSIS — F5101 Primary insomnia: Secondary | ICD-10-CM

## 2023-10-30 MED ORDER — ZOLPIDEM TARTRATE ER 6.25 MG PO TBCR
6.2500 mg | EXTENDED_RELEASE_TABLET | Freq: Every evening | ORAL | 0 refills | Status: DC | PRN
Start: 2023-10-30 — End: 2024-01-24

## 2023-10-30 MED ORDER — ZOLPIDEM TARTRATE ER 6.25 MG PO TBCR
6.2500 mg | EXTENDED_RELEASE_TABLET | Freq: Every evening | ORAL | 0 refills | Status: DC | PRN
Start: 2023-10-30 — End: 2023-10-30

## 2024-01-16 ENCOUNTER — Telehealth: Admitting: Physician Assistant

## 2024-01-16 DIAGNOSIS — M545 Low back pain, unspecified: Secondary | ICD-10-CM | POA: Diagnosis not present

## 2024-01-16 MED ORDER — CYCLOBENZAPRINE HCL 10 MG PO TABS
10.0000 mg | ORAL_TABLET | Freq: Three times a day (TID) | ORAL | 0 refills | Status: DC | PRN
Start: 2024-01-16 — End: 2024-03-27

## 2024-01-16 MED ORDER — PREDNISONE 10 MG (21) PO TBPK
ORAL_TABLET | ORAL | 0 refills | Status: DC
Start: 2024-01-16 — End: 2024-03-27

## 2024-01-16 NOTE — Progress Notes (Signed)
I have spent 5 minutes in review of e-visit questionnaire, review and updating patient chart, medical decision making and response to patient.   Piedad Climes, PA-C

## 2024-01-16 NOTE — Progress Notes (Signed)

## 2024-01-24 ENCOUNTER — Other Ambulatory Visit: Payer: Self-pay | Admitting: Family Medicine

## 2024-01-24 DIAGNOSIS — F5101 Primary insomnia: Secondary | ICD-10-CM

## 2024-01-25 MED ORDER — ZOLPIDEM TARTRATE ER 6.25 MG PO TBCR
6.2500 mg | EXTENDED_RELEASE_TABLET | Freq: Every evening | ORAL | 0 refills | Status: DC | PRN
Start: 2024-01-25 — End: 2024-04-23

## 2024-02-26 ENCOUNTER — Encounter: Admitting: Family Medicine

## 2024-03-10 ENCOUNTER — Encounter: Admitting: Family Medicine

## 2024-03-27 ENCOUNTER — Ambulatory Visit (INDEPENDENT_AMBULATORY_CARE_PROVIDER_SITE_OTHER): Admitting: Family Medicine

## 2024-03-27 ENCOUNTER — Encounter: Payer: Self-pay | Admitting: Family Medicine

## 2024-03-27 VITALS — BP 110/78 | HR 57 | Temp 98.0°F | Ht 72.0 in | Wt 200.0 lb

## 2024-03-27 DIAGNOSIS — Z125 Encounter for screening for malignant neoplasm of prostate: Secondary | ICD-10-CM

## 2024-03-27 DIAGNOSIS — D229 Melanocytic nevi, unspecified: Secondary | ICD-10-CM

## 2024-03-27 DIAGNOSIS — F5101 Primary insomnia: Secondary | ICD-10-CM

## 2024-03-27 DIAGNOSIS — E7800 Pure hypercholesterolemia, unspecified: Secondary | ICD-10-CM

## 2024-03-27 DIAGNOSIS — Z131 Encounter for screening for diabetes mellitus: Secondary | ICD-10-CM

## 2024-03-27 DIAGNOSIS — Z0000 Encounter for general adult medical examination without abnormal findings: Secondary | ICD-10-CM

## 2024-03-27 NOTE — Progress Notes (Signed)
 Established Patient Office Visit   Subjective:  Patient ID: Antonio Graham, male    DOB: 12-12-1966  Age: 58 y.o. MRN: [id number]  Chief Complaint  Patient presents with   Annual Exam    HPI Encounter Diagnoses  Name Primary?   Healthcare maintenance Yes   Elevated LDL cholesterol level    Screening for diabetes mellitus    Primary insomnia    Screening for prostate cancer    Atypical nevi    Doing well.  Continues physical activity with biking and running.  Has regular dental care.  Working full-time.  Ongoing follow-up for stable aortic aneurysm and bicuspid aortic valve.   Review of Systems  Constitutional: Negative.   HENT: Negative.    Eyes:  Negative for blurred vision, discharge and redness.  Respiratory: Negative.    Cardiovascular: Negative.   Gastrointestinal:  Negative for abdominal pain.  Genitourinary: Negative.   Musculoskeletal: Negative.  Negative for myalgias.  Skin:  Negative for rash.  Neurological:  Negative for tingling, loss of consciousness and weakness.  Endo/Heme/Allergies:  Negative for polydipsia.      03/27/2024    4:17 PM 02/22/2023    8:43 AM 01/05/2022    9:19 AM  Depression screen PHQ 2/9  Decreased Interest 0 0 0  Down, Depressed, Hopeless 0 0 0  PHQ - 2 Score 0 0 0  Altered sleeping 0  2  Tired, decreased energy 0  0  Change in appetite 0  0  Feeling bad or failure about yourself  0  0  Trouble concentrating 0  0  Moving slowly or fidgety/restless 0  0  Suicidal thoughts 0  0  PHQ-9 Score 0  2  Difficult doing work/chores Not difficult at all  Not difficult at all       Current Outpatient Medications:    Cholecalciferol 50 MCG (2000 UT) CAPS, Take 1 capsule by mouth daily., Disp: , Rfl:    levothyroxine (SYNTHROID) 175 MCG tablet, Take 1 tablet (175 mcg total) by mouth daily. (Patient taking differently: Take 188 mcg by mouth daily.), Disp: 90 tablet, Rfl: 3   Multiple Vitamin (MULTI VITAMIN MENS) tablet, Take 1 tablet by  mouth daily., Disp: , Rfl:    zolpidem (AMBIEN CR) 6.25 MG CR tablet, Take 1 tablet (6.25 mg total) by mouth at bedtime as needed., Disp: 90 tablet, Rfl: 0   cyclobenzaprine (FLEXERIL) 10 MG tablet, Take 1 tablet (10 mg total) by mouth 3 (three) times daily as needed., Disp: 15 tablet, Rfl: 0   predniSONE (STERAPRED UNI-PAK 21 TAB) 10 MG (21) TBPK tablet, Take following package directions, Disp: 21 tablet, Rfl: 0   Objective:     BP 110/78 (BP Location: Right Arm, Patient Position: Sitting, Cuff Size: Normal)   Pulse (!) 57   Temp 98 F (36.7 C) (Temporal)   Ht 6' (1.829 m)   Wt 200 lb (90.7 kg)   SpO2 98%   BMI 27.12 kg/m    Physical Exam Constitutional:      General: He is not in acute distress.    Appearance: Normal appearance. He is not ill-appearing, toxic-appearing or diaphoretic.  HENT:     Head: Normocephalic and atraumatic.     Right Ear: Tympanic membrane, ear canal and external ear normal.     Left Ear: Tympanic membrane, ear canal and external ear normal.     Mouth/Throat:     Mouth: Mucous membranes are moist.     Pharynx: Oropharynx is clear.  No oropharyngeal exudate or posterior oropharyngeal erythema.  Eyes:     General: No scleral icterus.       Right eye: No discharge.        Left eye: No discharge.     Extraocular Movements: Extraocular movements intact.     Conjunctiva/sclera: Conjunctivae normal.     Pupils: Pupils are equal, round, and reactive to light.  Cardiovascular:     Rate and Rhythm: Normal rate and regular rhythm.  Pulmonary:     Effort: Pulmonary effort is normal. No respiratory distress.     Breath sounds: Normal breath sounds. No wheezing or rales.  Abdominal:     General: Bowel sounds are normal.     Tenderness: There is no abdominal tenderness. There is no guarding or rebound.     Hernia: There is no hernia in the left inguinal area or right inguinal area.  Genitourinary:    Penis: Circumcised. No hypospadias, erythema, tenderness,  discharge, swelling or lesions.      Testes:        Right: Mass, tenderness or swelling not present. Right testis is descended.        Left: Mass, tenderness or swelling not present. Left testis is descended.     Epididymis:     Right: Not inflamed or enlarged.     Left: Not inflamed or enlarged.  Musculoskeletal:     Cervical back: No rigidity or tenderness.  Lymphadenopathy:     Cervical: No cervical adenopathy.     Lower Body: No right inguinal adenopathy. No left inguinal adenopathy.  Skin:    General: Skin is warm and dry.  Neurological:     Mental Status: He is alert and oriented to person, place, and time.  Psychiatric:        Mood and Affect: Mood normal.        Behavior: Behavior normal.      No results found for any visits on 03/27/24.    The 10-year ASCVD risk score (Arnett DK, et al., 2019) is: 5.7%    Assessment & Plan:   Healthcare maintenance -     CBC with Differential/Platelet -     Urinalysis, Routine w reflex microscopic  Elevated LDL cholesterol level -     Comprehensive metabolic panel with GFR -     Lipid panel  Screening for diabetes mellitus -     Comprehensive metabolic panel with GFR -     Hemoglobin A1c  Primary insomnia  Screening for prostate cancer -     PSA  Atypical nevi -     Ambulatory referral to Dermatology    Return in about 1 year (around 03/27/2025), or if symptoms worsen or fail to improve.  Continue healthy active lifestyle.  Information given on health maintenance and disease prevention as well as preventing elevated cholesterol.  Derm referral for atypical nevi seen on the back.  Mliss Sax, MD

## 2024-03-28 LAB — COMPREHENSIVE METABOLIC PANEL WITH GFR
ALT: 20 U/L (ref 0–53)
AST: 23 U/L (ref 0–37)
Albumin: 5.2 g/dL (ref 3.5–5.2)
Alkaline Phosphatase: 62 U/L (ref 39–117)
BUN: 19 mg/dL (ref 6–23)
CO2: 28 meq/L (ref 19–32)
Calcium: 10.1 mg/dL (ref 8.4–10.5)
Chloride: 100 meq/L (ref 96–112)
Creatinine, Ser: 0.98 mg/dL (ref 0.40–1.50)
GFR: 86.23 mL/min (ref >60.00)
Glucose, Bld: 85 mg/dL (ref 70–99)
Potassium: 4.3 meq/L (ref 3.5–5.1)
Sodium: 137 meq/L (ref 135–145)
Total Bilirubin: 0.6 mg/dL (ref 0.2–1.2)
Total Protein: 7.8 g/dL (ref 6.0–8.3)

## 2024-03-28 LAB — HEMOGLOBIN A1C: Hgb A1c MFr Bld: 5.6 % (ref 4.6–6.5)

## 2024-03-28 LAB — CBC WITH DIFFERENTIAL/PLATELET
Basophils Absolute: 0 10*3/uL (ref 0.0–0.1)
Basophils Relative: 0.6 % (ref 0.0–3.0)
Eosinophils Absolute: 0.1 10*3/uL (ref 0.0–0.7)
Eosinophils Relative: 1.2 % (ref 0.0–5.0)
HCT: 45 % (ref 39.0–52.0)
Hemoglobin: 15.4 g/dL (ref 13.0–17.0)
Lymphocytes Relative: 42.5 % (ref 12.0–46.0)
Lymphs Abs: 2.8 10*3/uL (ref 0.7–4.0)
MCHC: 34.3 g/dL (ref 30.0–36.0)
MCV: 87.1 fl (ref 78.0–100.0)
Monocytes Absolute: 0.7 10*3/uL (ref 0.1–1.0)
Monocytes Relative: 10 % (ref 3.0–12.0)
Neutro Abs: 3 10*3/uL (ref 1.4–7.7)
Neutrophils Relative %: 45.7 % (ref 43.0–77.0)
Platelets: 248 10*3/uL (ref 150.0–400.0)
RBC: 5.17 Mil/uL (ref 4.22–5.81)
RDW: 14.1 % (ref 11.5–15.5)
WBC: 6.6 10*3/uL (ref 4.0–10.5)

## 2024-03-28 LAB — URINALYSIS, ROUTINE W REFLEX MICROSCOPIC
Bilirubin Urine: NEGATIVE
Hgb urine dipstick: NEGATIVE
Ketones, ur: NEGATIVE
Leukocytes,Ua: NEGATIVE
Nitrite: NEGATIVE
Specific Gravity, Urine: 1.01 (ref 1.000–1.030)
Total Protein, Urine: NEGATIVE
Urine Glucose: NEGATIVE
Urobilinogen, UA: 0.2 (ref 0.0–1.0)
pH: 6 (ref 5.0–8.0)

## 2024-03-28 LAB — LIPID PANEL
Cholesterol: 243 mg/dL — ABNORMAL HIGH (ref 0–200)
HDL: 52.1 mg/dL (ref >39.00)
LDL Cholesterol: 160 mg/dL — ABNORMAL HIGH (ref 0–99)
NonHDL: 190.98
Total CHOL/HDL Ratio: 5
Triglycerides: 153 mg/dL — ABNORMAL HIGH (ref 0.0–149.0)
VLDL: 30.6 mg/dL (ref 0.0–40.0)

## 2024-03-28 LAB — PSA: PSA: 0.76 ng/mL (ref 0.10–4.00)

## 2024-04-23 ENCOUNTER — Other Ambulatory Visit: Payer: Self-pay | Admitting: Family Medicine

## 2024-04-23 DIAGNOSIS — F5101 Primary insomnia: Secondary | ICD-10-CM

## 2024-04-24 MED ORDER — ZOLPIDEM TARTRATE ER 6.25 MG PO TBCR: 6.2500 mg | EXTENDED_RELEASE_TABLET | Freq: Every evening | ORAL | 0 refills | Status: DC | PRN

## 2024-04-29 ENCOUNTER — Other Ambulatory Visit: Payer: Self-pay | Admitting: Family Medicine

## 2024-04-29 DIAGNOSIS — F5101 Primary insomnia: Secondary | ICD-10-CM

## 2024-06-16 DIAGNOSIS — E890 Postprocedural hypothyroidism: Secondary | ICD-10-CM | POA: Diagnosis not present

## 2024-06-18 DIAGNOSIS — C770 Secondary and unspecified malignant neoplasm of lymph nodes of head, face and neck: Secondary | ICD-10-CM | POA: Diagnosis not present

## 2024-06-18 DIAGNOSIS — E890 Postprocedural hypothyroidism: Secondary | ICD-10-CM | POA: Diagnosis not present

## 2024-06-18 DIAGNOSIS — C73 Malignant neoplasm of thyroid gland: Secondary | ICD-10-CM | POA: Diagnosis not present

## 2024-07-31 ENCOUNTER — Other Ambulatory Visit: Payer: Self-pay | Admitting: Family Medicine

## 2024-07-31 DIAGNOSIS — F5101 Primary insomnia: Secondary | ICD-10-CM

## 2024-08-19 DIAGNOSIS — E890 Postprocedural hypothyroidism: Secondary | ICD-10-CM | POA: Diagnosis not present

## 2024-08-20 DIAGNOSIS — C770 Secondary and unspecified malignant neoplasm of lymph nodes of head, face and neck: Secondary | ICD-10-CM | POA: Diagnosis not present

## 2024-08-20 DIAGNOSIS — C73 Malignant neoplasm of thyroid gland: Secondary | ICD-10-CM | POA: Diagnosis not present

## 2024-08-20 DIAGNOSIS — E890 Postprocedural hypothyroidism: Secondary | ICD-10-CM | POA: Diagnosis not present

## 2024-09-04 ENCOUNTER — Ambulatory Visit (INDEPENDENT_AMBULATORY_CARE_PROVIDER_SITE_OTHER): Admitting: Dermatology

## 2024-09-04 ENCOUNTER — Encounter: Payer: Self-pay | Admitting: Dermatology

## 2024-09-04 VITALS — BP 124/74 | HR 83

## 2024-09-04 DIAGNOSIS — L814 Other melanin hyperpigmentation: Secondary | ICD-10-CM | POA: Diagnosis not present

## 2024-09-04 DIAGNOSIS — L578 Other skin changes due to chronic exposure to nonionizing radiation: Secondary | ICD-10-CM | POA: Diagnosis not present

## 2024-09-04 DIAGNOSIS — L821 Other seborrheic keratosis: Secondary | ICD-10-CM

## 2024-09-04 DIAGNOSIS — Z1283 Encounter for screening for malignant neoplasm of skin: Secondary | ICD-10-CM | POA: Diagnosis not present

## 2024-09-04 DIAGNOSIS — W908XXA Exposure to other nonionizing radiation, initial encounter: Secondary | ICD-10-CM | POA: Diagnosis not present

## 2024-09-04 DIAGNOSIS — D1739 Benign lipomatous neoplasm of skin and subcutaneous tissue of other sites: Secondary | ICD-10-CM

## 2024-09-04 DIAGNOSIS — D171 Benign lipomatous neoplasm of skin and subcutaneous tissue of trunk: Secondary | ICD-10-CM

## 2024-09-04 DIAGNOSIS — D229 Melanocytic nevi, unspecified: Secondary | ICD-10-CM

## 2024-09-04 DIAGNOSIS — D1801 Hemangioma of skin and subcutaneous tissue: Secondary | ICD-10-CM | POA: Diagnosis not present

## 2024-09-04 NOTE — Progress Notes (Unsigned)
   New Patient Visit   Subjective  Antonio Graham is a 58 y.o. male who presents for the following: Skin Cancer Screening and Upper Body Skin Exam  The patient presents for Upper Body Skin Exam (UBSE) for skin cancer screening and mole check. The patient has spots, moles and lesions to be evaluated, some may be new or changing.  Pt has no hx of skin cancer nor family hx  The following portions of the chart were reviewed this encounter and updated as appropriate: medications, allergies, medical history  Review of Systems:  No other skin or systemic complaints except as noted in HPI or Assessment and Plan.  Objective  Well appearing patient in no apparent distress; mood and affect are within normal limits.  All skin waist up examined. Relevant physical exam findings are noted in the Assessment and Plan.    Assessment & Plan  Skin cancer screening performed today.  Actinic Damage - Chronic condition, secondary to cumulative UV/sun exposure - diffuse scaly erythematous macules with underlying dyspigmentation - Recommend daily broad spectrum sunscreen SPF 30+ to sun-exposed areas, reapply every 2 hours as needed.  - Staying in the shade or wearing long sleeves, sun glasses (UVA+UVB protection) and wide brim hats (4-inch brim around the entire circumference of the hat) are also recommended for sun protection.  - Call for new or changing lesions.  Melanocytic Nevi - Tan-brown and/or pink-flesh-colored symmetric macules and papules - Benign appearing on exam today - Observation - Call clinic for new or changing moles - Recommend daily use of broad spectrum spf 30+ sunscreen to sun-exposed areas.   LENTIGINES Exam: scattered tan macules Due to sun exposure Treatment Plan: Benign-appearing, observe. Recommend daily broad spectrum sunscreen SPF 30+ to sun-exposed areas, reapply every 2 hours as needed.  Call for any changes   HEMANGIOMA Exam: red papule(s) Discussed benign nature.  Recommend observation. Call for changes.   SEBORRHEIC KERATOSIS - Stuck-on, waxy, tan-brown papules and/or plaques  - Benign-appearing - Discussed benign etiology and prognosis. - Observe - Call for any changes  Lipoma  Exam: Subcutaneous rubbery nodule(s) Location: back  Benign-appearing. Exam most consistent with an Lipoma. Discussed that a Lipoma is a benign fatty growth that can grow over time and sometimes become painful or otherwise symptomatic. Some patients may have one or several lipomas.. Benign Hereditary Lipomatosis is a hereditary familial condition where family members tend to grow multiple lipomas.  Recommend observation if it is not changing, growing or symptomatic. Recommend surgical excision to remove it if it is painful, growing, symptomatic, or other changes noted. Please contact our office for new or changing lesions so they can be evaluated.     Return if symptoms worsen or fail to improve.  I, Darice Smock, CMA, am acting as scribe for RUFUS CHRISTELLA HOLY, MD.   Documentation: I have reviewed the above documentation for accuracy and completeness, and I agree with the above.  RUFUS CHRISTELLA HOLY, MD

## 2024-09-04 NOTE — Patient Instructions (Signed)

## 2024-09-11 DIAGNOSIS — I080 Rheumatic disorders of both mitral and aortic valves: Secondary | ICD-10-CM | POA: Diagnosis not present

## 2024-09-11 DIAGNOSIS — I7121 Aneurysm of the ascending aorta, without rupture: Secondary | ICD-10-CM | POA: Diagnosis not present

## 2024-09-11 DIAGNOSIS — Q2381 Bicuspid aortic valve: Secondary | ICD-10-CM | POA: Diagnosis not present

## 2024-09-11 DIAGNOSIS — Z7182 Exercise counseling: Secondary | ICD-10-CM | POA: Diagnosis not present

## 2024-09-11 DIAGNOSIS — I77810 Thoracic aortic ectasia: Secondary | ICD-10-CM | POA: Diagnosis not present

## 2024-09-11 DIAGNOSIS — I351 Nonrheumatic aortic (valve) insufficiency: Secondary | ICD-10-CM | POA: Diagnosis not present

## 2024-09-11 DIAGNOSIS — Q231 Congenital insufficiency of aortic valve: Secondary | ICD-10-CM | POA: Diagnosis not present

## 2024-09-11 DIAGNOSIS — Q2543 Congenital aneurysm of aorta: Secondary | ICD-10-CM | POA: Diagnosis not present

## 2024-09-23 ENCOUNTER — Other Ambulatory Visit (HOSPITAL_COMMUNITY): Payer: Self-pay

## 2024-10-14 DIAGNOSIS — C73 Malignant neoplasm of thyroid gland: Secondary | ICD-10-CM | POA: Diagnosis not present

## 2024-10-14 DIAGNOSIS — C770 Secondary and unspecified malignant neoplasm of lymph nodes of head, face and neck: Secondary | ICD-10-CM | POA: Diagnosis not present

## 2024-10-14 DIAGNOSIS — E890 Postprocedural hypothyroidism: Secondary | ICD-10-CM | POA: Diagnosis not present

## 2024-10-29 ENCOUNTER — Other Ambulatory Visit: Payer: Self-pay | Admitting: Family Medicine

## 2024-10-29 DIAGNOSIS — F5101 Primary insomnia: Secondary | ICD-10-CM

## 2024-10-30 NOTE — Telephone Encounter (Signed)
 Refill request for pts Ambien  CR 6.25mg  LOV 03/27/2024 No FOV scheduled Last refill 08/01/2024.  Medication is pending for your approval
